# Patient Record
Sex: Female | Born: 1965 | Race: Black or African American | Hispanic: No | Smoking: Never smoker
Health system: Southern US, Community
[De-identification: ages and names within clinical notes are randomized; demographics above are authoritative.]

## PROBLEM LIST (undated history)

## (undated) DIAGNOSIS — I10 Essential (primary) hypertension: Secondary | ICD-10-CM

## (undated) DIAGNOSIS — T7840XA Allergy, unspecified, initial encounter: Secondary | ICD-10-CM

## (undated) HISTORY — DX: Allergy, unspecified, initial encounter: T78.40XA

---

## 1998-01-18 ENCOUNTER — Encounter: Admission: RE | Admit: 1998-01-18 | Discharge: 1998-01-18 | Payer: Self-pay | Admitting: Obstetrics

## 2000-06-11 ENCOUNTER — Encounter: Admission: RE | Admit: 2000-06-11 | Discharge: 2000-06-11 | Payer: Self-pay | Admitting: Obstetrics

## 2002-03-31 ENCOUNTER — Other Ambulatory Visit: Admission: RE | Admit: 2002-03-31 | Discharge: 2002-03-31 | Payer: Self-pay | Admitting: Obstetrics and Gynecology

## 2002-03-31 ENCOUNTER — Encounter: Admission: RE | Admit: 2002-03-31 | Discharge: 2002-03-31 | Payer: Self-pay | Admitting: Obstetrics and Gynecology

## 2005-10-26 ENCOUNTER — Emergency Department (HOSPITAL_COMMUNITY): Admission: EM | Admit: 2005-10-26 | Discharge: 2005-10-26 | Payer: Self-pay | Admitting: Family Medicine

## 2006-05-24 ENCOUNTER — Emergency Department (HOSPITAL_COMMUNITY): Admission: EM | Admit: 2006-05-24 | Discharge: 2006-05-24 | Payer: Self-pay | Admitting: Family Medicine

## 2007-02-16 ENCOUNTER — Emergency Department (HOSPITAL_COMMUNITY): Admission: EM | Admit: 2007-02-16 | Discharge: 2007-02-16 | Payer: Self-pay | Admitting: Emergency Medicine

## 2007-03-01 ENCOUNTER — Ambulatory Visit (HOSPITAL_COMMUNITY): Admission: RE | Admit: 2007-03-01 | Discharge: 2007-03-01 | Payer: Self-pay | Admitting: Obstetrics & Gynecology

## 2007-03-01 ENCOUNTER — Encounter: Payer: Self-pay | Admitting: Internal Medicine

## 2008-10-25 ENCOUNTER — Encounter: Payer: Self-pay | Admitting: Internal Medicine

## 2008-11-01 ENCOUNTER — Encounter: Payer: Self-pay | Admitting: Internal Medicine

## 2008-11-03 ENCOUNTER — Ambulatory Visit: Payer: Self-pay | Admitting: *Deleted

## 2008-11-03 ENCOUNTER — Ambulatory Visit: Payer: Self-pay | Admitting: Internal Medicine

## 2008-11-03 ENCOUNTER — Encounter: Payer: Self-pay | Admitting: Internal Medicine

## 2008-11-03 ENCOUNTER — Inpatient Hospital Stay (HOSPITAL_COMMUNITY): Admission: AD | Admit: 2008-11-03 | Discharge: 2008-11-07 | Payer: Self-pay | Admitting: Internal Medicine

## 2008-11-03 ENCOUNTER — Ambulatory Visit: Payer: Self-pay | Admitting: Cardiology

## 2008-11-03 DIAGNOSIS — R55 Syncope and collapse: Secondary | ICD-10-CM | POA: Insufficient documentation

## 2008-11-03 LAB — CONVERTED CEMR LAB
ALT: 28 units/L (ref 0–35)
AST: 26 units/L (ref 0–37)
Albumin: 3.2 g/dL — ABNORMAL LOW (ref 3.5–5.2)
Basophils Absolute: 0 10*3/uL (ref 0.0–0.1)
Basophils Relative: 1 % (ref 0–1)
CO2: 26 meq/L (ref 19–32)
Calcium: 8.8 mg/dL (ref 8.4–10.5)
Chloride: 105 meq/L (ref 96–112)
Cholesterol: 123 mg/dL (ref 0–200)
Creatinine, Ser: 0.99 mg/dL (ref 0.40–1.20)
Eosinophils Relative: 1 % (ref 0–5)
HCT: 40.7 % (ref 36.0–46.0)
Ketones, ur: NEGATIVE mg/dL
Lymphocytes Relative: 38 % (ref 12–46)
MCHC: 34.3 g/dL (ref 30.0–36.0)
MCV: 95.3 fL (ref 78.0–100.0)
Magnesium: 2 mg/dL (ref 1.5–2.5)
Neutro Abs: 3.9 10*3/uL (ref 1.7–7.7)
Nitrite: NEGATIVE
Platelets: 188 10*3/uL (ref 150–400)
RBC: 4.27 M/uL (ref 3.87–5.11)
RDW: 12.8 % (ref 11.5–15.5)
Sodium: 139 meq/L (ref 135–145)
TSH: 2.28 microintl units/mL (ref 0.350–4.50)
Total Bilirubin: 0.5 mg/dL (ref 0.3–1.2)
Total CHOL/HDL Ratio: 2
Total Protein: 7.7 g/dL (ref 6.0–8.3)
Urobilinogen, UA: 1 (ref 0.0–1.0)
VLDL: 22 mg/dL (ref 0–40)
pH: 6 (ref 5.0–8.0)

## 2008-11-04 ENCOUNTER — Ambulatory Visit: Payer: Self-pay | Admitting: Vascular Surgery

## 2008-11-04 ENCOUNTER — Encounter: Payer: Self-pay | Admitting: Internal Medicine

## 2008-11-07 ENCOUNTER — Encounter (INDEPENDENT_AMBULATORY_CARE_PROVIDER_SITE_OTHER): Payer: Self-pay | Admitting: Internal Medicine

## 2008-11-09 ENCOUNTER — Ambulatory Visit: Payer: Self-pay | Admitting: Internal Medicine

## 2008-11-09 DIAGNOSIS — Z86711 Personal history of pulmonary embolism: Secondary | ICD-10-CM | POA: Insufficient documentation

## 2008-11-09 LAB — CONVERTED CEMR LAB: INR: 3.4

## 2008-11-13 ENCOUNTER — Ambulatory Visit: Payer: Self-pay | Admitting: *Deleted

## 2008-11-13 LAB — CONVERTED CEMR LAB: INR: 2.1

## 2008-11-20 ENCOUNTER — Ambulatory Visit: Payer: Self-pay | Admitting: Internal Medicine

## 2008-11-20 LAB — CONVERTED CEMR LAB: INR: 1.8

## 2008-11-27 ENCOUNTER — Ambulatory Visit: Payer: Self-pay | Admitting: Internal Medicine

## 2008-12-11 ENCOUNTER — Ambulatory Visit: Payer: Self-pay | Admitting: Internal Medicine

## 2008-12-11 LAB — CONVERTED CEMR LAB

## 2008-12-13 ENCOUNTER — Emergency Department (HOSPITAL_COMMUNITY): Admission: EM | Admit: 2008-12-13 | Discharge: 2008-12-13 | Payer: Self-pay | Admitting: Emergency Medicine

## 2008-12-25 ENCOUNTER — Ambulatory Visit: Payer: Self-pay | Admitting: Internal Medicine

## 2009-01-08 ENCOUNTER — Ambulatory Visit: Payer: Self-pay | Admitting: Internal Medicine

## 2009-01-08 ENCOUNTER — Encounter: Payer: Self-pay | Admitting: Pharmacist

## 2009-01-17 ENCOUNTER — Telehealth: Payer: Self-pay | Admitting: Internal Medicine

## 2009-01-19 ENCOUNTER — Encounter: Payer: Self-pay | Admitting: Internal Medicine

## 2009-01-29 ENCOUNTER — Ambulatory Visit: Payer: Self-pay | Admitting: Internal Medicine

## 2009-01-29 LAB — CONVERTED CEMR LAB: INR: 2.2

## 2009-02-08 ENCOUNTER — Ambulatory Visit (HOSPITAL_COMMUNITY): Admission: RE | Admit: 2009-02-08 | Discharge: 2009-02-08 | Payer: Self-pay | Admitting: Internal Medicine

## 2009-02-19 ENCOUNTER — Ambulatory Visit: Payer: Self-pay | Admitting: Internal Medicine

## 2009-02-19 LAB — CONVERTED CEMR LAB: INR: 2.7

## 2009-04-02 ENCOUNTER — Ambulatory Visit: Payer: Self-pay | Admitting: Internal Medicine

## 2009-04-02 LAB — CONVERTED CEMR LAB: INR: 4

## 2009-05-03 ENCOUNTER — Ambulatory Visit: Payer: Self-pay | Admitting: Internal Medicine

## 2009-07-13 ENCOUNTER — Ambulatory Visit: Payer: Self-pay | Admitting: Surgery

## 2009-07-13 ENCOUNTER — Encounter (INDEPENDENT_AMBULATORY_CARE_PROVIDER_SITE_OTHER): Payer: Self-pay | Admitting: Sports Medicine

## 2009-07-13 ENCOUNTER — Ambulatory Visit (HOSPITAL_COMMUNITY): Admission: RE | Admit: 2009-07-13 | Discharge: 2009-07-13 | Payer: Self-pay | Admitting: Sports Medicine

## 2009-08-29 ENCOUNTER — Ambulatory Visit: Payer: Self-pay | Admitting: Internal Medicine

## 2009-08-29 DIAGNOSIS — S83509A Sprain of unspecified cruciate ligament of unspecified knee, initial encounter: Secondary | ICD-10-CM | POA: Insufficient documentation

## 2009-09-17 ENCOUNTER — Encounter: Payer: Self-pay | Admitting: Internal Medicine

## 2010-02-15 ENCOUNTER — Ambulatory Visit (HOSPITAL_COMMUNITY): Admission: RE | Admit: 2010-02-15 | Discharge: 2010-02-15 | Payer: Self-pay | Admitting: Obstetrics & Gynecology

## 2010-02-22 ENCOUNTER — Ambulatory Visit (HOSPITAL_COMMUNITY): Admission: RE | Admit: 2010-02-22 | Discharge: 2010-02-22 | Payer: Self-pay | Admitting: Obstetrics & Gynecology

## 2010-09-10 ENCOUNTER — Ambulatory Visit: Payer: Self-pay | Admitting: Internal Medicine

## 2010-09-10 DIAGNOSIS — G4700 Insomnia, unspecified: Secondary | ICD-10-CM | POA: Insufficient documentation

## 2010-09-13 ENCOUNTER — Ambulatory Visit (HOSPITAL_COMMUNITY)
Admission: RE | Admit: 2010-09-13 | Discharge: 2010-09-13 | Payer: Self-pay | Source: Home / Self Care | Attending: Internal Medicine | Admitting: Internal Medicine

## 2010-09-26 DIAGNOSIS — E559 Vitamin D deficiency, unspecified: Secondary | ICD-10-CM | POA: Insufficient documentation

## 2010-09-26 DIAGNOSIS — R928 Other abnormal and inconclusive findings on diagnostic imaging of breast: Secondary | ICD-10-CM | POA: Insufficient documentation

## 2010-09-27 LAB — CONVERTED CEMR LAB
HDL: 65 mg/dL (ref >39)
LDL Cholesterol: 64 mg/dL (ref 0–99)
TSH: 1.368 microintl units/mL (ref 0.350–4.50)
Vit D, 25-Hydroxy: <10 ng/mL — ABNORMAL LOW (ref 30–89)

## 2010-09-30 ENCOUNTER — Encounter
Admission: RE | Admit: 2010-09-30 | Discharge: 2010-09-30 | Payer: Self-pay | Source: Home / Self Care | Attending: Internal Medicine | Admitting: Internal Medicine

## 2010-10-06 ENCOUNTER — Encounter: Payer: Self-pay | Admitting: Obstetrics

## 2010-10-15 NOTE — Letter (Signed)
Summary: Generic Letter  [facility]  [address]   [place], [place] [address]   Phone: [phone]  Fax: [phone]    09/17/2009  TO: Valma Cava, MD Hassan Rowan  RE: TAIMI TOWE [address] [place] [place], [place]  [address]  Dear Dr. Thomasena Edis,  This letter is in reference to Ms. Irine Heminger who was evaluated in my office on 12/15 for pre-surgical medical clearance after sustaining ligamentous injury to her knee. As you know, Ms. Potts has a history of Pulmonary embolism in February 2009 felt to be caused by the use of oral contraceptives. She completed 6 months of coumadin therapy and has had no signs of recurrent problems or symptoms. She is otherwise extremely active and healthy. I think by June of 2011 she should be safe for surgical intervention. I am concerned about her post operative period and risk for DVT, so depending on her length of immobilization, she will need to be on some form of anticoagulation.   Please contact me if you have any further concerns or questions.    Sincerely,    Anderson Malta, DO

## 2010-10-17 NOTE — Assessment & Plan Note (Signed)
Summary: FU VISIT/DS   Vital Signs:  Patient profile:   45 year old female Height:      65 inches Weight:      175.2 pounds BMI:     29.26 Temp:     97.8 degrees F oral Pulse rate:   69 / minute BP sitting:   119 / 79  (right arm)  Vitals Entered By: Filomena Jungling NT II (September 10, 2010 3:55 PM) Is Patient Diabetic? No Pain Assessment Patient in pain? no      Nutritional Status BMI of 25 - 29 = overweight  Have you ever been in a relationship where you felt threatened, hurt or afraid?No   Does patient need assistance? Functional Status Self care Ambulation Normal   History of Present Illness: Margaret Pacheco is here for routine follow-up and preventive care. No questions or concerns today. No leg pain or SOB. Back to her normal activity level.  Anticoagulation Management History:      Her last INR was 4.0.    Preventive Screening-Counseling & Management  Alcohol-Tobacco     Alcohol drinks/day: 0     Smoking Status: never  Caffeine-Diet-Exercise     Does Patient Exercise: yes     Type of exercise: weights  Current Medications (verified): 1)  Trazodone Hcl 150 Mg Tabs (Trazodone Hcl) .... Take 1 Tablet By Mouth Once A Day At Bedtime As Needed For Sleep  Allergies (verified): 1)  ! Pcn 2)  ! Tri-Norinyl (28) (Norethin-Eth Estrad Triphasic)   Impression & Recommendations:  Problem # 1:  PULMONARY EMBOLISM (ICD-415.19) Resolved. Doing well-will continue to follow for any signs of PAH.  Problem # 2:  ANTERIOR CRUCIATE LIGAMENT TEAR, RIGHT KNEE (ICD-844.2) Injury has stabilized. Pain with over activity. Will check vitamin D level.  Orders: T-Vitamin D (25-Hydroxy) [phone])  Problem # 3:  Preventive Health Care (ICD-V70.0) Due for Mammogram, lipids, routine BW. Discussed GYN care and DVT risk.   Greater than 25 minutes spent in face to face discussion with this patient and in review of the medical record. Greater than 50% of this time was spent in education,  counseling and providing instruction on complex medication and medical management issues.   Problem # 4:  INSOMNIA (ICD-780.52) Mild issues with sleep-will try Trazadone. Discussed sleep hygiene and dynamics. Will check TSH screen.  Orders: T-TSH ([id number])  Complete Medication List: 1)  Trazodone Hcl 150 Mg Tabs (Trazodone hcl) .... Take 1 tablet by mouth once a day at bedtime as needed for sleep  Other Orders: T-Lipid Profile ([id number]) Mammogram (Screening) (Mammo)  Patient Instructions: 1)  F/U 1 year or as needed 2)  Will contact you regarding aby abnormal labs. Prescriptions: TRAZODONE HCL 150 MG TABS (TRAZODONE HCL) Take 1 tablet by mouth once a day at bedtime as needed for sleep  #30 x 0   Entered and Authorized by:   Julaine Fusi  DO   Signed by:   Julaine Fusi  DO on 09/10/2010   Method used:   Electronically to        [facility] [phone]* (retail)       [address]       [address]       [place], [place]  [address]       Ph: [phone]       Fax: [phone]   RxID:   [id number]    Orders Added: 1)  T-Lipid Profile [[id number]] 2)  Est. Patient Level III [10932] 3)  T-TSH [[id number]] 4)  T-Vitamin D (25-Hydroxy) [phone] 5)  Mammogram (Screening) [Mammo]    Prevention & Chronic Care Immunizations   Influenza vaccine: Not documented   Influenza vaccine deferral: Refused  (09/10/2010)    Tetanus booster: Not documented   Td booster deferral: Deferred  (09/10/2010)    Pneumococcal vaccine: Not documented  Other Screening   Pap smear: Not documented   Pap smear action/deferral: Not indicated-other  (09/10/2010)    Mammogram: No specific mammographic evidence of malignancy.    (02/21/2008)   Mammogram action/deferral: Ordered  (09/10/2010)   Smoking status: never  (09/10/2010)    Screening comments: [facility] see's for GYN Care  Lipids   Total Cholesterol: 123  (11/03/2008)   Lipid panel  action/deferral: Lipid Panel ordered   LDL: 40  (11/03/2008)   LDL Direct: Not documented   HDL: 61  (11/03/2008)   Triglycerides: 111  (11/03/2008)   Nursing Instructions: Schedule screening mammogram (see order)   Process Orders Check Orders Results:     [facility]: ABN not required for this insurance Tests Sent for requisitioning (September 26, 2010 3:52 PM):     09/10/2010: [facility] -- T-Lipid Profile [phone] (signed)     09/10/2010: [facility] -- T-TSH [phone] (signed)     09/10/2010: [facility] -- T-Vitamin D (25-Hydroxy) [phone] (signed)

## 2010-11-15 ENCOUNTER — Other Ambulatory Visit: Payer: Self-pay | Admitting: Internal Medicine

## 2010-11-15 DIAGNOSIS — R9230 Dense breasts, unspecified: Secondary | ICD-10-CM

## 2010-11-15 DIAGNOSIS — R922 Inconclusive mammogram: Secondary | ICD-10-CM

## 2010-11-15 DIAGNOSIS — Z803 Family history of malignant neoplasm of breast: Secondary | ICD-10-CM

## 2010-12-02 LAB — CBC
MCHC: 34.4 g/dL (ref 30.0–36.0)
RBC: 4.17 MIL/uL (ref 3.87–5.11)
WBC: 5.6 10*3/uL (ref 4.0–10.5)

## 2010-12-02 LAB — PREGNANCY, URINE
Preg Test, Ur: NEGATIVE
Preg Test, Ur: NEGATIVE

## 2010-12-26 LAB — BASIC METABOLIC PANEL
CO2: 29 mEq/L (ref 19–32)
Chloride: 103 mEq/L (ref 96–112)
GFR calc Af Amer: >60 mL/min (ref >60)
Sodium: 138 mEq/L (ref 135–145)

## 2010-12-26 LAB — DIFFERENTIAL
Basophils Relative: 0 % (ref 0–1)
Eosinophils Absolute: 0 10*3/uL (ref 0.0–0.7)
Lymphs Abs: 1.1 10*3/uL (ref 0.7–4.0)
Monocytes Absolute: 0.2 10*3/uL (ref 0.1–1.0)
Monocytes Relative: 3 % (ref 3–12)

## 2010-12-26 LAB — URINALYSIS, ROUTINE W REFLEX MICROSCOPIC
Bilirubin Urine: NEGATIVE
Ketones, ur: NEGATIVE mg/dL
Leukocytes, UA: NEGATIVE
Nitrite: NEGATIVE
Specific Gravity, Urine: 1.012 (ref 1.005–1.030)
Urobilinogen, UA: 0.2 mg/dL (ref 0.0–1.0)

## 2010-12-26 LAB — CBC
Hemoglobin: 14.5 g/dL (ref 12.0–15.0)
MCHC: 34.2 g/dL (ref 30.0–36.0)
MCV: 94 fL (ref 78.0–100.0)
RBC: 4.5 MIL/uL (ref 3.87–5.11)

## 2010-12-26 LAB — URINE MICROSCOPIC-ADD ON

## 2010-12-31 LAB — LUPUS ANTICOAGULANT PANEL
DRVVT: 45.1 secs (ref 36.1–47.0)
Lupus Anticoagulant: NOT DETECTED
PTT Lupus Anticoagulant: 152.9 secs — ABNORMAL HIGH (ref 36.3–48.8)
PTTLA 4:1 Mix: 99.8 secs — ABNORMAL HIGH (ref 36.3–48.8)
PTTLA Confirmation: 6.3 secs (ref <8.0)

## 2010-12-31 LAB — BASIC METABOLIC PANEL
BUN: 6 mg/dL (ref 6–23)
CO2: 24 mEq/L (ref 19–32)
CO2: 25 mEq/L (ref 19–32)
CO2: 26 mEq/L (ref 19–32)
Calcium: 7.9 mg/dL — ABNORMAL LOW (ref 8.4–10.5)
Calcium: 8.4 mg/dL (ref 8.4–10.5)
Calcium: 8.5 mg/dL (ref 8.4–10.5)
Creatinine, Ser: 0.81 mg/dL (ref 0.4–1.2)
Creatinine, Ser: 0.83 mg/dL (ref 0.4–1.2)
Creatinine, Ser: 0.94 mg/dL (ref 0.4–1.2)
GFR calc Af Amer: >60 mL/min (ref >60)
GFR calc Af Amer: >60 mL/min (ref >60)
GFR calc non Af Amer: >60 mL/min (ref >60)
GFR calc non Af Amer: >60 mL/min (ref >60)
GFR calc non Af Amer: >60 mL/min (ref >60)
Glucose, Bld: 79 mg/dL (ref 70–99)
Glucose, Bld: 87 mg/dL (ref 70–99)
Sodium: 137 mEq/L (ref 135–145)
Sodium: 139 mEq/L (ref 135–145)

## 2010-12-31 LAB — CBC
HCT: 34.4 % — ABNORMAL LOW (ref 36.0–46.0)
Hemoglobin: 11.9 g/dL — ABNORMAL LOW (ref 12.0–15.0)
Hemoglobin: 12.9 g/dL (ref 12.0–15.0)
MCHC: 34.6 g/dL (ref 30.0–36.0)
MCHC: 34.9 g/dL (ref 30.0–36.0)
MCHC: 34.9 g/dL (ref 30.0–36.0)
MCV: 96 fL (ref 78.0–100.0)
Platelets: 173 10*3/uL (ref 150–400)
Platelets: 192 10*3/uL (ref 150–400)
RBC: 3.59 MIL/uL — ABNORMAL LOW (ref 3.87–5.11)
RDW: 12.2 % (ref 11.5–15.5)
RDW: 12.3 % (ref 11.5–15.5)
RDW: 12.4 % (ref 11.5–15.5)

## 2010-12-31 LAB — CARDIAC PANEL(CRET KIN+CKTOT+MB+TROPI)
CK, MB: 1.4 ng/mL (ref 0.3–4.0)
CK, MB: 1.6 ng/mL (ref 0.3–4.0)
CK, MB: 2 ng/mL (ref 0.3–4.0)
Relative Index: 1.2 (ref 0.0–2.5)
Relative Index: 1.2 (ref 0.0–2.5)
Total CK: 131 U/L (ref 7–177)
Total CK: 163 U/L (ref 7–177)

## 2010-12-31 LAB — URINE CULTURE
Colony Count: NO GROWTH
Culture: NO GROWTH
Special Requests: NEGATIVE

## 2010-12-31 LAB — PROTIME-INR
Prothrombin Time: 14.3 seconds (ref 11.6–15.2)
Prothrombin Time: 18.2 seconds — ABNORMAL HIGH (ref 11.6–15.2)

## 2010-12-31 LAB — URINALYSIS, ROUTINE W REFLEX MICROSCOPIC
Bilirubin Urine: NEGATIVE
Glucose, UA: NEGATIVE mg/dL
Protein, ur: NEGATIVE mg/dL
Specific Gravity, Urine: 1.01 (ref 1.005–1.030)
Urobilinogen, UA: 0.2 mg/dL (ref 0.0–1.0)

## 2010-12-31 LAB — BETA-2-GLYCOPROTEIN I ABS, IGG/M/A: Beta-2-Glycoprotein I IgM: <4 U/mL (ref <10)

## 2010-12-31 LAB — URINE MICROSCOPIC-ADD ON

## 2010-12-31 LAB — CARDIOLIPIN ANTIBODIES, IGG, IGM, IGA
Anticardiolipin IgA: 9 [APL'U] — ABNORMAL LOW (ref <13)
Anticardiolipin IgG: <7 [GPL'U] — ABNORMAL LOW (ref <11)
Anticardiolipin IgM: <7 [MPL'U] — ABNORMAL LOW (ref <10)

## 2010-12-31 LAB — HEPARIN LEVEL (UNFRACTIONATED)
Heparin Unfractionated: 0.27 IU/mL — ABNORMAL LOW (ref 0.30–0.70)
Heparin Unfractionated: 0.39 IU/mL (ref 0.30–0.70)
Heparin Unfractionated: 0.56 IU/mL (ref 0.30–0.70)

## 2010-12-31 LAB — PROTEIN S ACTIVITY: Protein S Activity: 101 % (ref 69–129)

## 2010-12-31 LAB — HOMOCYSTEINE: Homocysteine: 7.6 umol/L (ref 4.0–15.4)

## 2010-12-31 LAB — RAPID URINE DRUG SCREEN, HOSP PERFORMED
Cocaine: NOT DETECTED
Opiates: NOT DETECTED
Tetrahydrocannabinol: NOT DETECTED

## 2010-12-31 LAB — LIPID PANEL
HDL: 42 mg/dL (ref >39)
Total CHOL/HDL Ratio: 2.5 RATIO
VLDL: 18 mg/dL (ref 0–40)

## 2010-12-31 LAB — PROTEIN C ACTIVITY: Protein C Activity: 183 % — ABNORMAL HIGH (ref 75–133)

## 2010-12-31 LAB — PROTHROMBIN GENE MUTATION

## 2010-12-31 LAB — PREGNANCY, URINE: Preg Test, Ur: NEGATIVE

## 2011-01-28 NOTE — Discharge Summary (Signed)
Margaret Pacheco, Margaret Pacheco NO.:  [id number]   MEDICAL RECORD NO.:  [id number]          PATIENT TYPE:  INP   LOCATION:  4714                         FACILITY:  [facility]   PHYSICIAN:  C. Ulyess Mort, M.D.DATE OF BIRTH:  09/05/1966   DATE OF ADMISSION:  11/03/2008  DATE OF DISCHARGE:  11/07/2008                               DISCHARGE SUMMARY   DISCHARGE DIAGNOSIS:  Pulmonary embolism.   DISCHARGE MEDICATIONS:  1. Lovenox 85 mg subcutaneous injection twice daily.  2. Coumadin 5 mg daily in the evening.   DISPOSITION AND FOLLOWUP:  The patient will return to the [facility] Teaching [facility] for a Warfarin Clinic appointment with  Dr. Michaell Cowing, phone number [phone].  The appointment is in 2 days on  Thursday, November 09, 2008, at 3:15.  This appointment is to check the  patient's INR level.  This appointment is also for a warfarin education,  and while at this appointment, the patient will make her followup  appointment with Dr. Anderson Malta at the same location at [facility].  The patient will follow with  Dr. Phillips Odor for coordination of care, for followup following this  massive pulmonary embolism, for prescription of warfarin, followup of  her hypercoagulable panel, and for management of all other medical  problems.  The patient should also schedule an appointment with Dr. Antionette Char, phone number [phone].  The patient is to follow with  Dr. Tamela Oddi to discuss the patient's recent discontinuation of  oral birth control secondary to pulmonary embolism and discuss other  methods of birth control.   PROCEDURES PERFORMED:  The patient had a CT angiogram on November 03, 2008, indicative of pulmonary embolism.  This study found significant  bilateral pulmonary emboli involving branches of the pulmonary arteries  primarily, the lower lobes, lingular and right middle lobe and also to  both  upper lobes.  The emboli involving branches of lower lobes are  partially occlusive.  Impression:  Significant bilateral pulmonary  emboli primarily involving branches to the lower lobes, which are  partially occlusive.   The patient also had a plain film of the left finger and thumb, which  showed no acute fracture and normal alignment.   The patient also had a plain film of the left wrist where there was no  acute fracture seen, a normal radiocarpal joint space, and normal  position of the carpal bones.   The patient also had a 2-D echo on November 03, 2008, this echo showed  right ventricular dilatation with moderate-to-severe right ventricular  dysfunction.  Recommended evaluation for pulmonary embolus and possible  right heart catheterization, this was read by Dr. Nicholes Mango.   CONSULTANTS:  Everardo Beals Juanda Chance, MD, [facility], Cardiology.  Dr. Juanda Chance  initially saw the patient for evaluation of syncope.  At that time, the  troponin returned during that visit was elevated.  This elevation was  isolated in comparison to CK, CK-MB.  Dr. Juanda Chance recommended CT  angiogram for pulmonary embolism and also noted on echocardiogram that  the patient had right ventricular enlargement and recommended later  right and left heart catheterization if the pulmonary embolism proved  negative.   BRIEF ADMITTING HISTORY AND PHYSICAL:  Margaret Pacheco is a previously  healthy 45 year old woman with no medical problems who had a syncopal  event 2 days prior to admission.  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  No loss of bladder or bowel continence.  No biting of  tongue.  The patient awoke and said that she felt very out of  breath  like she had just run a race.  She was not confused.  She did not have  any paresthesias or any signs of being postictal.  She went to the [facility] Emergency Department where workup included a head CT, which was  reportedly negative for the patient and basic chemistries, which per the  patient and per records obtained showed a potassium level of 3.  Per the  patient, the patient was discharged with a potassium supplement and sent  to follow up with Outpatient Medicine Clinic 2 days later, which is the  day of admission.  The patient appeared to the Outpatient Medicine  Clinic on the day of admission and was evaluated by Dr. Anderson Malta, who felt that her story of syncope was quite concerning and  warranted inpatient admission for evaluation of the patient's syncope.  When I saw the patient on the floor, she related a similar story as I  mentioned in the terms of the syncope history.  She denied any recent  plane travel, any recent surgery, or immobilization.  Denied any history  of clotting or bleeding problems in her family, denied seizures  disorders in her family, denied sudden cardiac death in her family.  She  also denied any type of recent sickness or illness.  She says the last  time she was sick was over 4 months ago.  She did endorse some decreased  urine output at the time.   ALLERGIES:  PENICILLIN, which causes rash.   PAST MEDICAL HISTORY:  Significant for the recent ER visits at [facility] ED for this syncopal event and hypokalemia.  The patient did have  childhood asthma.  She is not on any current medication and she gives a  history of allergic rhinitis.  She is also not on any medications and  does not see a doctor for that.   MEDICATIONS:  Oral contraceptive pills and this potassium supplement  that she was prescribed in [facility] ED.   SOCIAL HISTORY:  The patient is not a smoker, has never been a smoker.  Does not drink.  Does not use cocaine or  IV drugs.  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   FAMILY HISTORY:  Negative for everything, I mentioned in the HPI.  Her  mother died of congestive heart failure at 29.  Her father died of lung  cancer at 75.  She has 4 siblings.  They are healthy, they do have  hypertension and diabetes in her family.  There is no history of breast  cancer, clotting disorder, bleeding disorder, seizure disorder, or  sudden death.  PHYSICAL EXAMINATION:  VITAL SIGNS:  Temperature 97.3, blood pressure  132/92, pulse 101, she was sating 99% on room air.  GENERAL:  She is very comfortable, not in any acute distress.  Her eyes  are anicteric.  Pupils are equal, round, and reactive to light.  Extraocular movements were intact.  Oropharynx is moist.  There was no  erythema or exudate.  NECK:  There is no JVD or thyromegaly.  LUNGS:  Clear to auscultation bilaterally.  There were no wheezes,  rhonchi, no crackles.  There was good air movement.  CARDIOVASCULAR:  Regular rhythm and rate.  No murmurs, rubs, or gallops.  ABDOMEN:  The patient did have some periumbilical tenderness.  No  distention.  She also did have some right CVA tenderness.  No guarding.  No rebound.  EXTREMITIES:  The lower extremities were equal and identical in size.  There was no absolutely no edema in either extremity.  There are no  palpable cords in either calf.  There were good pulses distally in both  upper and lower extremities.  NEUROLOGIC:  Cranial nerves II through XII were grossly intact.  The  patient's strength was full.  Strength and sensation were full and  symmetric throughout.   ADMITTING LABORATORY DATA:  Sodium 139, potassium 4.3, chloride 105,  bicarb 26, BUN 10, creatinine 1, and glucose 89.  White blood cell count  7.3 with ANC of 3.9, hemoglobin and hematocrit 13.9 and 40.7, platelets  188, bilirubin 25, alkaline phosphatase 84, AST 26, ALT 28, protein 7.7,  albumin 3.2, and calcium 8.8.  The  patient did have a urinalysis on  admission; however, it is contaminated.   HOSPITAL COURSE BY PROBLEM:  1. Syncope secondary to bilateral pulmonary emboli.  The patient was      admitted for syncope workup.  She initially included a very broad      differential including arrhythmia, electrolyte imbalance, seizure,      orthostatic hypotension, and structural neurologic.  During the      initial cardiac workup, the patient did have an EKG, which showed      prolong QT and some T-wave abnormalities in the lateral leads.  The      patient got a cardiac echo, which showed right ventricular      dilatation with strain suggesting pulmonary embolism.  At that      time, a stat CT angiogram was ordered and the patient was found to      have a massive pulmonary embolism with some obstruction in the      lower lobes.  Throughout this, the patient remained hemodynamically      stable, was not dyspneic, and was not short of breath.  The patient      was transferred to an ICU, was started on a morphine drip and      remained asymptomatic.  After the CT angiogram showed this massive      pulmonary embolism, Cardiology was consulted.  Along with the      primary team, Dr. Daiva Eves was also consulted who was the primary      attending over the weekend to discuss possible need for      thrombolytics in this patient.  It was felt that given the      patient's lack of any type of hemodynamic instability that the use      of thrombolytics was not indicated.  The patient was continued on      heparin  drip for 1-day, warfarin was held until November 05, 2008,      in case the patient did become hemodynamically unstable.  The      patient did not and was started on Coumadin on November 05, 2008.      On discharge, the patient's INR is 1.4.  She will be followed in      the Warfarin Clinic with Dr. Michaell Cowing to recheck therapeutic INR of      2.0.  This Coumadin will be continued indefinitely given the       patient's history of unprovoked DVT and/or unprovoked massive      pulmonary embolism.  It should be noted the patient did not have      any other risk factors for pulmonary embolism other than oral      contraceptive pills.  There is a hypercoagulable panel that remains      pending, that may elucidate the cause for this pulmonary embolism.      If not, the utility of longterm anticoagulation must be weighed      with the risks for this patient.  This decision will be handled in      the outpatient clinic by Dr. Anderson Malta.  2. Hypokalemia.  The patient was reportedly hypokalemic at the [facility] ED with a calcium of 3.0, however, her potassium during her      stay here was always within the normal limits.  She was never able      to describe any GI or urinary cause of this loss of potassium.  We      will follow in outpatient clinic.  3. Left thumb and wrist pain.  When the patient had her syncopal      event, she did fall on her hand and related some pain to her thumb      and wrist.  The patient had no scaphoid tenderness.  The patient      had no fracture.  The patient had full range of motion and good      distal pulses.  The swelling went down during her stay.   DISCHARGE LABS AND VITALS:  Chemistries are from November 06, 2008;  sodium 137, potassium 3.5, chloride 104, bicarb 25, BUN 7, creatinine  0.8, and glucose 81.  White blood cell count 6.5, hematocrit and  hemoglobin 12.9 and 37, and platelets 192.  The patient had a PT/INR,  which was 18.2 and 1.4 respectively and heparin level which was 0.41.  On discharge, the patient had taken heparin and Coumadin.  Instead of  heparin, she will take Lovenox for the next several days until her INR  become therapeutic.  The patient is comfortable giving this medication  to herself subcutaneously.  The patient's sister is an Charity fundraiser who is also  comfortable helping her with the shot.  However, we will keep her until  this  evening, so that she can have some education on the shot and see  one injection given on her own.  Vitals on the day of discharge, temperature 97.1, sytolic blood pressure  124/136, diastolic blood pressure 84/87, pulse 71, breathing 18 times a  minute.   Dictation by Sarita Bottom, MS IV      Joaquin Courts, MD  Electronically Signed      C. Ulyess Mort, M.D.  Electronically Signed    VW/MEDQ  D:  11/07/2008  T:  11/08/2008  Job:  [id number]  cc:   Edsel Petrin, D.O.  Roseanna Rainbow, M.D.

## 2011-01-28 NOTE — H&P (Signed)
NAMESHARRYN, BELDING NO.:  [id number]   MEDICAL RECORD NO.:  [id number]          PATIENT TYPE:  INP   LOCATION:  2316                         FACILITY:  [facility]   PHYSICIAN:  Everardo Beals. Juanda Chance, MD, FACCDATE OF BIRTH:  01/19/1966   DATE OF ADMISSION:  11/03/2008  DATE OF DISCHARGE:                              HISTORY & PHYSICAL   REFERRING PHYSICIAN:  C. Ulyess Mort, M.D.   REASON FOR REFERRAL:  Evaluation of syncope and an abnormal ECG and  echocardiogram.   CLINICAL HISTORY:  Mrs. Bellisario is 45 years old and lives in [place]  and is a Runner, broadcasting/film/video and also coaches.  She is quite active and works out  regularly.  On Wednesday morning, she developed some shortness of breath  while doing some mild exercise.  Later in the afternoon, she was at  school walking across the schoolyard and felt lightheaded and dizzy and  then slumped to the ground and passed out briefly.  She was taken to  [facility] emergency room where she had an ECG and a CT scan.  A CT scan  of the head was reportedly negative.  She was subsequently seen in [facility] where a relative works and they performed  an echocardiogram, which showed right ventricular enlargement and right  ventricular dilatation.  There was minimal tricuspid regurgitation and  no right ventricular pressure could be estimated.  Her LV function was  good.  Based on this, she was admitted to the hospital for further  evaluation.   PAST MEDICAL HISTORY:  Negative for diabetes, hypertension,  hyperlipidemia.   She has not had any major surgery.   Her only medications include birth control pills.   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  She does not smoke.   FAMILY HISTORY:  Her mother has hypertension and diabetes and end-stage  renal disease.  Her father had diabetes and died of lung cancer.  There  is no history of coronary heart disease.   REVIEW OF SYSTEMS:   Positive for previous episode of dizziness.  Her  review of systems is otherwise negative.   PHYSICAL EXAMINATION:  VITAL SIGNS:  Blood pressure is 129/82 and the  pulse 86 and regular and the temperature 98.5.  There was no venous  tension.  NECK:  The carotid pulses were full without bruits.  CHEST:  Clear without rales or rhonchi.  CARDIAC:  Rhythm was regular.  The first and second heart sounds were  normal.  I did not think P2 was accentuated.  There was no murmur, and I  could hear no gallop.  ABDOMEN:  Soft with normal bowel sounds.  There is no  hepatosplenomegaly.  Peripheral pulses full with no peripheral edema.  MUSCULOSKELETAL:  No deformities.  SKIN:  Warm and dry.  NEUROLOGIC:  No focal neurological signs.   Her electrocardiogram initially showed lateral T-wave changes and then  subsequent ECG showed anterior T-wave inversions.   IMPRESSION:  1. Syncope of uncertain etiology.  2. Right ventricular enlargement  and right ventricular dysfunction,      rule out pulmonary embolism.  3. Positive troponin consistent with possible pulmonary embolism      versus possible non-ST elevation myocardial infarction.   RECOMMENDATIONS:  I certainly agree that pulmonary embolism is high on  the list of possibilities, and I agree with plans for a CT angio, which  is to be performed shortly.  If this gives a clear diagnosis of  pulmonary embolism, then I think that will explain the symptoms and  probably further evaluation is not needed.  If the CT angiogram is  negative for pulmonary embolism, I think she will need to be evaluated  further with a right and left heart catheterization.  We will follow her  with you.      Bruce Elvera Lennox Juanda Chance, MD, [facility]  Electronically Signed     BRB/MEDQ  D:  11/03/2008  T:  11/04/2008  Job:  [id number]

## 2011-03-17 ENCOUNTER — Encounter: Payer: Self-pay | Admitting: Internal Medicine

## 2011-07-03 LAB — POCT URINALYSIS DIP (DEVICE)
Ketones, ur: NEGATIVE
Operator id: 208841
Protein, ur: 100 — AB
Specific Gravity, Urine: 1.015

## 2011-07-03 LAB — URINE CULTURE: Colony Count: >=100000

## 2011-07-03 LAB — POCT PREGNANCY, URINE
Operator id: 235561
Preg Test, Ur: NEGATIVE

## 2011-08-22 ENCOUNTER — Other Ambulatory Visit: Payer: Self-pay | Admitting: Internal Medicine

## 2011-08-22 DIAGNOSIS — D649 Anemia, unspecified: Secondary | ICD-10-CM

## 2011-08-22 DIAGNOSIS — R0602 Shortness of breath: Secondary | ICD-10-CM

## 2011-08-25 ENCOUNTER — Other Ambulatory Visit (INDEPENDENT_AMBULATORY_CARE_PROVIDER_SITE_OTHER): Payer: BC Managed Care – PPO

## 2011-08-25 DIAGNOSIS — R0602 Shortness of breath: Secondary | ICD-10-CM

## 2011-08-25 DIAGNOSIS — D649 Anemia, unspecified: Secondary | ICD-10-CM

## 2011-08-25 LAB — CBC
HCT: 39.1 % (ref 36.0–46.0)
Hemoglobin: 12.7 g/dL (ref 12.0–15.0)
MCH: 30 pg (ref 26.0–34.0)
MCV: 92.2 fL (ref 78.0–100.0)
RBC: 4.24 MIL/uL (ref 3.87–5.11)

## 2011-08-25 LAB — COMPREHENSIVE METABOLIC PANEL
Alkaline Phosphatase: 77 U/L (ref 39–117)
BUN: 12 mg/dL (ref 6–23)
Glucose, Bld: 82 mg/dL (ref 70–99)
Sodium: 141 mEq/L (ref 135–145)
Total Bilirubin: 0.2 mg/dL — ABNORMAL LOW (ref 0.3–1.2)

## 2013-01-10 ENCOUNTER — Telehealth: Payer: Self-pay | Admitting: *Deleted

## 2013-01-10 NOTE — Telephone Encounter (Signed)
Spoke with patient is having surgery done on her feet.  Has a question as to if she will need a Clearance since she has a history of a PE.  Angelina Ok, RN 01/06/2013 9:00 AM.

## 2013-01-11 NOTE — Telephone Encounter (Signed)
That would be a question for the foot doctor as I have no idea what surgery will be done etc. Although I have never seen this patient and no one else has in the last 2 years or so and it may be wise to have her come in for a visit with someone to check on her health.   Dr. Dorise Hiss

## 2013-02-01 ENCOUNTER — Telehealth: Payer: Self-pay | Admitting: *Deleted

## 2013-02-21 ENCOUNTER — Ambulatory Visit: Payer: BC Managed Care – PPO | Admitting: Internal Medicine

## 2013-02-24 ENCOUNTER — Ambulatory Visit: Payer: BC Managed Care – PPO | Admitting: Internal Medicine

## 2013-02-28 ENCOUNTER — Encounter: Payer: Self-pay | Admitting: Internal Medicine

## 2013-02-28 ENCOUNTER — Ambulatory Visit (INDEPENDENT_AMBULATORY_CARE_PROVIDER_SITE_OTHER): Payer: BC Managed Care – PPO | Admitting: Internal Medicine

## 2013-02-28 VITALS — BP 122/76 | HR 73 | Temp 97.4°F | Ht 65.0 in | Wt 185.3 lb

## 2013-02-28 DIAGNOSIS — Z7189 Other specified counseling: Secondary | ICD-10-CM

## 2013-02-28 DIAGNOSIS — I2699 Other pulmonary embolism without acute cor pulmonale: Secondary | ICD-10-CM

## 2013-02-28 DIAGNOSIS — Z7689 Persons encountering health services in other specified circumstances: Secondary | ICD-10-CM | POA: Insufficient documentation

## 2013-02-28 DIAGNOSIS — E559 Vitamin D deficiency, unspecified: Secondary | ICD-10-CM

## 2013-02-28 LAB — CBC WITH DIFFERENTIAL/PLATELET
Basophils Absolute: 0 K/uL (ref 0.0–0.1)
Basophils Relative: 0 % (ref 0–1)
Eosinophils Absolute: 0.1 K/uL (ref 0.0–0.7)
Eosinophils Relative: 1 % (ref 0–5)
HCT: 39.3 % (ref 36.0–46.0)
Hemoglobin: 13.3 g/dL (ref 12.0–15.0)
Lymphocytes Relative: 42 % (ref 12–46)
Lymphs Abs: 2.4 K/uL (ref 0.7–4.0)
MCH: 31 pg (ref 26.0–34.0)
MCHC: 33.8 g/dL (ref 30.0–36.0)
MCV: 91.6 fL (ref 78.0–100.0)
Monocytes Absolute: 0.5 K/uL (ref 0.1–1.0)
Monocytes Relative: 9 % (ref 3–12)
Neutro Abs: 2.8 K/uL (ref 1.7–7.7)
Neutrophils Relative %: 48 % (ref 43–77)
Platelets: 282 K/uL (ref 150–400)
RBC: 4.29 MIL/uL (ref 3.87–5.11)
RDW: 13.7 % (ref 11.5–15.5)
WBC: 5.7 K/uL (ref 4.0–10.5)

## 2013-02-28 LAB — COMPLETE METABOLIC PANEL WITH GFR
BUN: 10 mg/dL (ref 6–23)
CO2: 28 mEq/L (ref 19–32)
Creat: 0.75 mg/dL (ref 0.50–1.10)
GFR, Est African American: >89 mL/min
GFR, Est Non African American: >89 mL/min
Glucose, Bld: 81 mg/dL (ref 70–99)
Total Bilirubin: 0.4 mg/dL (ref 0.3–1.2)
Total Protein: 7 g/dL (ref 6.0–8.3)

## 2013-02-28 LAB — LIPID PANEL
HDL: 64 mg/dL (ref >39)
Triglycerides: 67 mg/dL (ref <150)

## 2013-02-28 LAB — POCT GLYCOSYLATED HEMOGLOBIN (HGB A1C): Hemoglobin A1C: 5.3

## 2013-02-28 LAB — TSH: TSH: 2.111 u[IU]/mL (ref 0.350–4.500)

## 2013-02-28 LAB — GLUCOSE, CAPILLARY: Glucose-Capillary: 81 mg/dL (ref 70–99)

## 2013-02-28 NOTE — Patient Instructions (Addendum)
1. Follow up with Dr. Audley Hose in one month for annual exam 2. Will check you lab today.

## 2013-02-28 NOTE — Progress Notes (Signed)
Subjective:   Patient ID: Margaret Pacheco female   DOB: 14-Sep-1966 47 y.o.   MRN: [id number]  HPI: [name] is a 47 y.o. woman with PMH of PE ( 6 months coumadin), Asthma, and obesity, who presents to the clinic to re-establish the care. Last office visit in 2011.   Patient states that she is doing fine.  She doesn't have any complaints.  She is here for checkup. Please see my assessment and plan.  Past Medical History  Diagnosis Date  . Asthma   . Allergy    Current Outpatient Prescriptions  Medication Sig Dispense Refill  . ergocalciferol (VITAMIN D2) 50000 UNITS capsule Take 50,000 Units by mouth. Take one tablet po 3 times per week M,W,F as directed       . traZODone (DESYREL) 150 MG tablet Take 150 mg by mouth at bedtime.         No current facility-administered medications for this visit.   Family History  Problem Relation Age of Onset  . Heart disease Mother   . Cancer Father    History   Social History  . Marital Status: Single    Spouse Name: N/A    Number of Children: N/A  . Years of Education: N/A   Social History Main Topics  . Smoking status: Never Smoker   . Smokeless tobacco: None  . Alcohol Use: No  . Drug Use: No  . Sexually Active: None   Other Topics Concern  . 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   HEENT:  Denies congestion, sore throat, rhinorrhea, sneezing, mouth sores, trouble swallowing, neck pain   Respiratory:  Denies SOB, DOE, cough, and wheezing.   Cardiovascular:  Denies palpitations and leg swelling.   Gastrointestinal:  Denies nausea, vomiting, abdominal pain, diarrhea, constipation, blood in stool and abdominal distention.   Genitourinary:  Denies dysuria, urgency, frequency, hematuria, flank pain and difficulty  urinating.   Musculoskeletal:  Denies myalgias, back pain, joint swelling, arthralgias and gait problem.   Skin:  Denies pallor, rash and wound.   Neurological:  Denies dizziness, seizures, syncope, weakness, light-headedness, numbness and headaches.    .    Objective:  Physical Exam: Filed Vitals:   02/28/13 0831  BP: 122/76  Pulse: 73  Temp: 97.4 F (36.3 C)  TempSrc: Oral  Height: 5\' 5"  (1.651 m)  Weight: 185 lb 4.8 oz (84.052 kg)  SpO2: 100%   General: alert, well-developed, and cooperative to examination.  Head: normocephalic and atraumatic.  Eyes: vision grossly intact, pupils equal, pupils round, pupils reactive to light, no injection and anicteric.  Mouth: pharynx pink and moist, no erythema, and no exudates.  Neck: supple, full ROM, no thyromegaly, no JVD, and no carotid bruits.  Lungs: normal respiratory effort, no accessory muscle use, normal breath sounds, no crackles, and no wheezes. Heart: normal rate, regular rhythm, no murmur, no gallop, and no rub.  Abdomen: soft, non-tender, normal bowel sounds, no distention, no guarding, no rebound tenderness, no hepatomegaly, and no splenomegaly.  Msk: no joint swelling, no joint warmth, and no redness over joints.  Pulses: 2+ DP/PT pulses bilaterally Extremities: No cyanosis, clubbing, edema Neurologic: alert & oriented X3, cranial nerves II-XII intact, strength normal in all extremities, sensation intact to light touch, and gait normal.  Skin: turgor normal and no rashes.  Psych: Oriented X3, memory intact for recent and remote, normally interactive, good eye contact, not anxious appearing, and not depressed appearing.   Assessment & Plan:

## 2013-02-28 NOTE — Assessment & Plan Note (Signed)
Etiology was thought to be related to oral contraceptive.  This problem resolved. Completed 6 months of anticoagulation. Stopped coumadin on 04/23/09.   Plan:  --She has stopped all oral contraception. --I discussed in detail with her any red flag symptoms such leg pain, swelling, chest pain, SOB etc. -- She continues to be active at the gym and with school athletics.

## 2013-02-28 NOTE — Assessment & Plan Note (Addendum)
Margaret Pacheco is a 47 year old woman with PMH of PE, asthma and obesity who lost followups since 2011.  She's here today to reestablish her care.  She is doing well, and does not have any complaints. - Will obtain baseline labs--CMP, CBC, FLP, TSH, vitamin D, A1c. - She should followup with her primary care physician Dr. Genella Mech in one month.  A discussion with her PCP on Pap smear and mammogram is recommended during her follow up visit.

## 2013-02-28 NOTE — Assessment & Plan Note (Signed)
Patient was noted to have low vitamin D level, and was on oral vitamin D treatment in the past.  She states that she hasn't been taking any vitamin D supplement for years.   - Will check her vitamin D level

## 2013-02-28 NOTE — Progress Notes (Signed)
I discussed patient with resident Dr. Li, and I agree with the plans as outlined in her note. 

## 2013-03-01 LAB — VITAMIN D 25 HYDROXY (VIT D DEFICIENCY, FRACTURES): Vit D, 25-Hydroxy: 15 ng/mL — ABNORMAL LOW (ref 30–89)

## 2013-03-07 ENCOUNTER — Other Ambulatory Visit: Payer: Self-pay | Admitting: Internal Medicine

## 2013-03-07 DIAGNOSIS — E559 Vitamin D deficiency, unspecified: Secondary | ICD-10-CM

## 2013-03-25 ENCOUNTER — Other Ambulatory Visit: Payer: BC Managed Care – PPO

## 2013-08-18 ENCOUNTER — Other Ambulatory Visit: Payer: BC Managed Care – PPO

## 2013-09-05 NOTE — Addendum Note (Signed)
Addended by: Bufford Spikes on: 09/05/2013 09:46 AM   Modules accepted: Orders

## 2014-04-26 ENCOUNTER — Encounter (HOSPITAL_COMMUNITY): Payer: Self-pay

## 2015-01-30 ENCOUNTER — Emergency Department (HOSPITAL_COMMUNITY)
Admission: EM | Admit: 2015-01-30 | Discharge: 2015-01-30 | Disposition: A | Discharge status: Home / Self Care | Payer: BC Managed Care – PPO | Attending: Emergency Medicine | Admitting: Emergency Medicine

## 2015-01-30 ENCOUNTER — Emergency Department (HOSPITAL_COMMUNITY): Payer: BC Managed Care – PPO

## 2015-01-30 ENCOUNTER — Encounter (HOSPITAL_COMMUNITY): Payer: Self-pay

## 2015-01-30 ENCOUNTER — Other Ambulatory Visit: Payer: Self-pay

## 2015-01-30 DIAGNOSIS — Z880 Allergy status to penicillin: Secondary | ICD-10-CM | POA: Insufficient documentation

## 2015-01-30 DIAGNOSIS — J45909 Unspecified asthma, uncomplicated: Secondary | ICD-10-CM | POA: Diagnosis not present

## 2015-01-30 DIAGNOSIS — R5383 Other fatigue: Secondary | ICD-10-CM | POA: Insufficient documentation

## 2015-01-30 DIAGNOSIS — R0789 Other chest pain: Secondary | ICD-10-CM | POA: Diagnosis not present

## 2015-01-30 DIAGNOSIS — R079 Chest pain, unspecified: Secondary | ICD-10-CM

## 2015-01-30 LAB — BASIC METABOLIC PANEL
Anion gap: 11 (ref 5–15)
BUN: 11 mg/dL (ref 6–20)
CHLORIDE: 101 mmol/L (ref 101–111)
CO2: 30 mmol/L (ref 22–32)
Calcium: 9.2 mg/dL (ref 8.9–10.3)
Creatinine, Ser: 0.85 mg/dL (ref 0.44–1.00)
GFR calc Af Amer: >60 mL/min (ref >60)
GLUCOSE: 103 mg/dL — AB (ref 65–99)
POTASSIUM: 3.9 mmol/L (ref 3.5–5.1)
SODIUM: 142 mmol/L (ref 135–145)

## 2015-01-30 LAB — CBC
HCT: 42.9 % (ref 36.0–46.0)
HEMOGLOBIN: 14.8 g/dL (ref 12.0–15.0)
MCH: 31.6 pg (ref 26.0–34.0)
MCHC: 34.5 g/dL (ref 30.0–36.0)
MCV: 91.5 fL (ref 78.0–100.0)
PLATELETS: 333 10*3/uL (ref 150–400)
RBC: 4.69 MIL/uL (ref 3.87–5.11)
RDW: 12.3 % (ref 11.5–15.5)
WBC: 7.7 10*3/uL (ref 4.0–10.5)

## 2015-01-30 LAB — I-STAT TROPONIN, ED: Troponin i, poc: 0 ng/mL (ref 0.00–0.08)

## 2015-01-30 MED ORDER — IOHEXOL 350 MG/ML SOLN
80.0000 mL | Freq: Once | INTRAVENOUS | Status: AC | PRN
  Administered 2015-01-30: 80 mL via INTRAVENOUS

## 2015-01-30 NOTE — Discharge Instructions (Signed)
Return to the ED with any concerns including difficulty breathing, chest pain, fainting, changes in vision or speech, swelling of legs, weakness of arms or legs, decreased level of alertness/lethargy, or any other alarming symptoms

## 2015-01-30 NOTE — ED Notes (Signed)
Patient transported to X-ray 

## 2015-01-30 NOTE — ED Provider Notes (Signed)
CSN: [id number]     Arrival date & time 01/30/15  1611 History   First MD Initiated Contact with Patient 01/30/15 1641     Chief Complaint  Patient presents with  . Fatigue     (Consider location/radiation/quality/duration/timing/severity/associated sxs/prior Treatment) HPI  Pt presenting with c/o feeling fatigues, right sided upper chest pressure and tingling sensation in left arm.  She states she had her blood pressure checked and was told by a nurse that it was high- deoes not recall the number.  No difficulty breathing.  Symptoms not associated with exertion.  No leg swelling.  Pt does have hx of PE in the past- no longer on anticoagulants- completed treatment.  Pt states her symtpoms feel similar to prior PE.  There are no other associated systemic symptoms, there are no other alleviating or modifying factors.   Past Medical History  Diagnosis Date  . Asthma   . Allergy    No past surgical history on file. Family History  Problem Relation Age of Onset  . Heart disease Mother   . Cancer Father    History  Substance Use Topics  . Smoking status: Never Smoker   . Smokeless tobacco: Not on file  . Alcohol Use: No   OB History    No data available     Review of Systems  ROS reviewed and all otherwise negative except for mentioned in HPI    Allergies  Norethin-eth estrad triphasic and Penicillins  Home Medications   Prior to Admission medications   Medication Sig Start Date End Date Taking? Authorizing Provider  acyclovir (ZOVIRAX) 400 MG tablet Take 400 mg by mouth 3 (three) times daily. X10 01/23/15  Yes Historical Provider, MD   BP 105/82 mmHg  Pulse 73  Temp(Src) 97.8 F (36.6 C) (Oral)  Resp 12  Ht 5\' 5"  (1.651 m)  SpO2 100%  Vitals reviewed Physical Exam  Physical Examination: General appearance - alert, well appearing, and in no distress Mental status - alert, oriented to person, place, and time Eyes -  No conjunctival injection no scleral  icterus Mouth - mucous membranes moist, pharynx normal without lesions Chest - clear to auscultation, no wheezes, rales or rhonchi, symmetric air entry Heart - normal rate, regular rhythm, normal S1, S2, no murmurs, rubs, clicks or gallops Abdomen - soft, nontender, nondistended, no masses or organomegaly Neurological - awake and alert Extremities - peripheral pulses normal, no pedal edema, no clubbing or cyanosis Skin - normal coloration and turgor, no rashes  ED Course  Procedures (including critical care time)  5:34 PM have gone to see patient x 2, she remains in xray Labs Review Labs Reviewed  BASIC METABOLIC PANEL - Abnormal; Notable for the following:    Glucose, Bld 103 (*)    All other components within normal limits  CBC  I-STAT TROPOININ, ED    Imaging Review Dg Chest 2 View  01/30/2015   CLINICAL DATA:  Chest pain, left facial tingling  EXAM: CHEST  2 VIEW  COMPARISON:  02/08/2009  FINDINGS: Cardiomediastinal silhouette is stable. No acute infiltrate or pleural effusion. No pulmonary edema. Bony thorax is unremarkable.  IMPRESSION: No active cardiopulmonary disease.   Electronically Signed   By: Lahoma Crocker M.D.   On: 01/30/2015 17:22   Ct Angio Chest Pe W/cm &/or Wo Cm  01/30/2015   CLINICAL DATA:  Acute onset of left shoulder and arm tingling, and right chest pressure, now resolved. Fatigue. Initial encounter.  EXAM: CT ANGIOGRAPHY CHEST  WITH CONTRAST  TECHNIQUE: Multidetector CT imaging of the chest was performed using the standard protocol during bolus administration of intravenous contrast. Multiplanar CT image reconstructions and MIPs were obtained to evaluate the vascular anatomy.  CONTRAST:  29mL OMNIPAQUE IOHEXOL 350 MG/ML SOLN  COMPARISON:  Chest radiograph performed earlier today at 5:10 p.m., and CTA of the chest performed 02/08/2009  FINDINGS: There is no evidence of pulmonary embolus.  The lungs are clear bilaterally. There is no evidence of significant focal  consolidation, pleural effusion or pneumothorax. No masses are identified; no abnormal focal contrast enhancement is seen.  The mediastinum is unremarkable in appearance. No mediastinal lymphadenopathy is seen. No pericardial effusion is identified. The great vessels are grossly unremarkable in appearance. No axillary lymphadenopathy is seen. The visualized portions of the thyroid gland are unremarkable in appearance.  The visualized portions of the liver and spleen are unremarkable. The visualized portions of the pancreas, gallbladder, stomach, adrenal glands and kidneys are within normal limits.  No acute osseous abnormalities are seen.  Review of the MIP images confirms the above findings.  IMPRESSION: 1. No evidence of pulmonary embolus. 2. Lungs clear bilaterally.   Electronically Signed   By: Garald Balding M.D.   On: 01/30/2015 20:03     EKG Interpretation   Date/Time:  Tuesday Jan 30 2015 16:33:25 EDT Ventricular Rate:  76 PR Interval:  116 QRS Duration: 66 QT Interval:  400 QTC Calculation: 450 R Axis:   82 Text Interpretation:  Normal sinus rhythm Normal ECG Since previous  tracing t wave abnormality is less pronounced Confirmed by Canary Brim  MD,  Ladora Osterberg [phone]) on 01/30/2015 5:48:09 PM      MDM   Final diagnoses:  Chest pain  Other fatigue    Pt presenting with c/o right sided chest pressure, arm tingling, concern for high blood pressure and fatigue.  ekg reassuring, negative troponin. Blood pressure not significantly elevated in the ED.  Due to hx of PE, CT angio performed which was negative.  Discharged with strict return precautions.  Pt agreeable with plan.    Alfonzo Beers, MD 01/31/15 [id number]

## 2015-01-30 NOTE — ED Notes (Signed)
Pt reports around 12 noon left shoulder and arm tingling and right chest pressure.  No symptoms at this time.  Pt reports feeling fatigued.

## 2015-01-30 NOTE — ED Notes (Signed)
CT called to inquire when pt can be scanned.

## 2015-02-01 ENCOUNTER — Telehealth: Payer: Self-pay | Admitting: Internal Medicine

## 2015-02-01 NOTE — Telephone Encounter (Signed)
Call to patient to confirm appointment for 02/02/15 at 3:15 lmtcb

## 2015-02-02 ENCOUNTER — Encounter: Payer: BC Managed Care – PPO | Admitting: Internal Medicine

## 2015-02-07 ENCOUNTER — Telehealth: Payer: Self-pay | Admitting: Internal Medicine

## 2015-02-07 NOTE — Telephone Encounter (Signed)
Call to patient to confirm appointment for 02/08/15 at 3:15 lmtcb

## 2015-02-08 ENCOUNTER — Encounter: Payer: Self-pay | Admitting: Internal Medicine

## 2015-02-08 ENCOUNTER — Ambulatory Visit (INDEPENDENT_AMBULATORY_CARE_PROVIDER_SITE_OTHER): Payer: BC Managed Care – PPO | Admitting: Internal Medicine

## 2015-02-08 VITALS — BP 133/79 | HR 81 | Temp 98.2°F | Ht 65.0 in | Wt 198.4 lb

## 2015-02-08 DIAGNOSIS — Z09 Encounter for follow-up examination after completed treatment for conditions other than malignant neoplasm: Secondary | ICD-10-CM

## 2015-02-08 NOTE — Patient Instructions (Signed)
General Instructions:   Please try to bring all your medicines next time. This will help [place] keep you safe from mistakes.   Your symptoms maybe as a result of the medication you had been taking. All your tests were normal and your risk of any heart problems is very low. If any symptoms return make an appt right away to be seen.    Progress Toward Treatment Goals:  No flowsheet data found.  Self Care Goals & Plans:  No flowsheet data found.  No flowsheet data found.   Care Management & Community Referrals:  No flowsheet data found.

## 2015-02-08 NOTE — Progress Notes (Signed)
   Subjective:   Patient ID: Margaret Pacheco female   DOB: 1966/01/05 49 y.o.   MRN: [id number]  HPI: Margaret Pacheco is a 49 y.o. woman pmh as listed below presents for ED follow up.   Patient was seen in the ED on 01/30/15 for fatigue and right sided chest pressure. The patient had EKG, troponins and a CT angiogram that were all negative. Since that time the patient has felt fine without any complaints. She has had no return of any of her symptoms. She denied any repeat CP, SOB, DOE, LE edema, palpitations, HA, blurry vision, presyncope or syncope. She did state that she had been given some medicine by an UC for a breakout on her face and ever since she discontinued it she has had no problems. She is a non-smoker.    Past Medical History  Diagnosis Date  . Asthma   . Allergy    Current Outpatient Prescriptions  Medication Sig Dispense Refill  . acyclovir (ZOVIRAX) 400 MG tablet Take 400 mg by mouth 3 (three) times daily. X10  0   No current facility-administered medications for this visit.   Family History  Problem Relation Age of Onset  . Heart disease Mother   . Cancer Father    History   Social History  . Marital Status: Single    Spouse Name: N/A  . Number of Children: N/A  . Years of Education: N/A   Social History Main Topics  . Smoking status: Never Smoker   . Smokeless tobacco: Not on file  . Alcohol Use: No  . Drug Use: No  . Sexual Activity: Not on file   Other Topics Concern  . 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 Objective:  Physical Exam: Filed Vitals:   02/08/15 1608  BP: 133/79  Pulse: 81  Temp: 98.2 F (36.8 C)  Weight: 198 lb 6.4 oz (89.994 kg)  SpO2: 100%   General: sitting in chair, NAD Cardiac: RRR, no rubs, murmurs or gallops Pulm: clear to auscultation bilaterally, no crackles, wheezes or  rhonchi, moving normal volumes of air Abd: soft, nontender, nondistended, BS present Ext: warm and well perfused, no pedal edema, no palpable cords   Assessment & Plan:  Please see problem oriented charting  Pt discussed with Dr. Lynnae January

## 2015-02-08 NOTE — Assessment & Plan Note (Signed)
Patient is following up after ED visit for some atypical chest pain. The patient stated that these symptoms started after starting acyclovir and what appears to be prednisone that she received from an urgent care center when she had presumed zosters outbreak. She discontinued that medication after that day and has not had any return of her symptoms. The has a very low ASCVD score and no other signs or symptoms for concern of cardiac etiology. CTA was negative. Therefore at this time no need for stress testing or holter monitoring.  -f/u as needed.  Regular care in 2 years

## 2015-02-09 NOTE — Progress Notes (Signed)
Internal Medicine Clinic Attending  Case discussed with Dr. Sadek soon after the resident saw the patient.  We reviewed the resident's history and exam and pertinent patient test results.  I agree with the assessment, diagnosis, and plan of care documented in the resident's note. 

## 2015-05-24 ENCOUNTER — Telehealth: Payer: Self-pay | Admitting: Internal Medicine

## 2015-05-24 NOTE — Telephone Encounter (Signed)
Call to patient to confirm appointment for 05/25/15 at 3:45 lmtcb

## 2015-05-25 ENCOUNTER — Ambulatory Visit (INDEPENDENT_AMBULATORY_CARE_PROVIDER_SITE_OTHER): Payer: BC Managed Care – PPO | Admitting: Internal Medicine

## 2015-05-25 ENCOUNTER — Encounter: Payer: Self-pay | Admitting: Internal Medicine

## 2015-05-25 VITALS — BP 133/75 | HR 65 | Temp 97.7°F | Ht 65.0 in | Wt 200.6 lb

## 2015-05-25 DIAGNOSIS — Z021 Encounter for pre-employment examination: Secondary | ICD-10-CM

## 2015-05-25 DIAGNOSIS — Z09 Encounter for follow-up examination after completed treatment for conditions other than malignant neoplasm: Secondary | ICD-10-CM

## 2015-05-25 NOTE — Progress Notes (Signed)
Patient ID: Ballard Russell, female   DOB: 1966-06-18, 49 y.o.   MRN: [id number]   Subjective:   Patient ID: COOPER STAMP female   DOB: [birth year] 49 y.o.   MRN: [id number]  HPI: Ms.Kaelene AUNICA DAUPHINEE is a 49 y.o. PMH listed below. Presented today to request for a form to befilled that would certify that she has no limitations to work, in [place]. Otherwise she has no complains today.  Past Medical History  Diagnosis Date  . Asthma   . Allergy    Current Outpatient Prescriptions  Medication Sig Dispense Refill  . acyclovir (ZOVIRAX) 400 MG tablet Take 400 mg by mouth 3 (three) times daily. X10  0   No current facility-administered medications for this visit.   Family History  Problem Relation Age of Onset  . Heart disease Mother   . Cancer Father    Social History   Social History  . Marital Status: Single    Spouse Name: N/A  . Number of Children: N/A  . Years of Education: N/A   Social History Main Topics  . Smoking status: Never Smoker   . Smokeless tobacco: None  . Alcohol Use: No  . Drug Use: No  . Sexual Activity: Not Asked   Other Topics Concern  . 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 SKIN- No Rash, colour changes or itching. HEAD- No Headache or dizziness. EYES- No Vision loss, double or blurred vision. EARS- No vertigo, hearing loss or ear discharge. RESPIRATORY- No Cough or SOB. CARDIAC- No chest pain. GI- No  vomiting, diarrhoea, abd pain. URINARY- No Frequency, or dysuria.  Objective:  Physical Exam: Filed Vitals:   05/25/15 1611  BP: 133/75  Pulse: 65  Temp: 97.7 F (36.5 C)  TempSrc: Oral  Height: 5\' 5"  (1.651 m)  Weight: 200 lb 9.6 oz (90.992 kg)  SpO2: 100%   GENERAL- alert, co-operative, appears as stated age, not in any distress. HEENT- Atraumatic, normocephalic,  PERRL, CARDIAC- RRR, no murmurs, rubs or gallops. RESP- Moving equal volumes of air, and clear to auscultation bilaterally, no wheezes or crackles. ABDOMEN- Soft, nontender NEURO- Moving all extremities, gait normal. EXTREMITIES- no pedal edema. SKIN- Warm, dry, No rash or lesion. PSYCH- Normal mood and affect, appropriate thought content and speech.  Assessment & Plan:   The patient's case and plan of care was discussed with attending physician, Dr.Vincent.   Please see problem based charting for assessment and plan.

## 2015-05-26 NOTE — Assessment & Plan Note (Addendum)
Vision tested- Bilat- vision- 20/15. Hearing appears intact. Vitals WNL. Medical hx- PE- 2010, treated with coumadin, completed 6 months anticoag. She will get her flu shot at work- where she will get it free. She has had the TB skin test done and read at the health department, she will pi up the report. Form filled and signed.

## 2015-05-28 NOTE — Progress Notes (Signed)
Internal Medicine Clinic Attending  Case discussed with Dr. Emokpae soon after the resident saw the patient.  We reviewed the resident's history and exam and pertinent patient test results.  I agree with the assessment, diagnosis, and plan of care documented in the resident's note. 

## 2015-09-04 ENCOUNTER — Encounter: Payer: BC Managed Care – PPO | Admitting: Internal Medicine

## 2015-09-12 ENCOUNTER — Encounter: Payer: Self-pay | Admitting: Internal Medicine

## 2015-09-12 ENCOUNTER — Ambulatory Visit (HOSPITAL_COMMUNITY)
Admission: RE | Admit: 2015-09-12 | Discharge: 2015-09-12 | Disposition: A | Discharge status: Home / Self Care | Payer: BC Managed Care – PPO | Source: Ambulatory Visit | Attending: Internal Medicine | Admitting: Internal Medicine

## 2015-09-12 ENCOUNTER — Ambulatory Visit (INDEPENDENT_AMBULATORY_CARE_PROVIDER_SITE_OTHER): Payer: BC Managed Care – PPO | Admitting: Internal Medicine

## 2015-09-12 VITALS — BP 110/66 | HR 72 | Temp 97.6°F | Ht 65.0 in | Wt 203.3 lb

## 2015-09-12 DIAGNOSIS — Z23 Encounter for immunization: Secondary | ICD-10-CM

## 2015-09-12 DIAGNOSIS — M25562 Pain in left knee: Secondary | ICD-10-CM | POA: Diagnosis not present

## 2015-09-12 DIAGNOSIS — M25469 Effusion, unspecified knee: Secondary | ICD-10-CM | POA: Insufficient documentation

## 2015-09-12 DIAGNOSIS — M7989 Other specified soft tissue disorders: Secondary | ICD-10-CM | POA: Diagnosis not present

## 2015-09-12 DIAGNOSIS — M25462 Effusion, left knee: Secondary | ICD-10-CM | POA: Diagnosis not present

## 2015-09-12 NOTE — Progress Notes (Signed)
   Subjective:    Patient ID: Margaret Pacheco, female    DOB: 07/06/1966, 49 y.o.   MRN: [id number]  HPI Comments: Margaret Pacheco is a 49 year old woman with PMH as below.  She says she is here for "follow-up."   Also has subacute c/o left knee swelling.  Please see problem based charting for status of this condition.  Knee Pain  There was no injury mechanism. The pain is present in the left knee. The pain is at a severity of 0/10. The patient is experiencing no pain. Pertinent negatives include no inability to bear weight. Nothing aggravates the symptoms. She has tried ice and NSAIDs for the symptoms. The treatment provided no relief.  Knee sometimes feels like it will give out.   Working out at gym (bike, eliptical, treadmill) 3-4 x per week.  Past Medical History  Diagnosis Date  . Asthma   . Allergy    No current outpatient prescriptions on file prior to visit.   No current facility-administered medications on file prior to visit.    Review of Systems  Constitutional: Positive for unexpected weight change. Negative for fever, chills, appetite change and fatigue.       Weight gain.  Respiratory: Negative for shortness of breath.   Cardiovascular: Negative for chest pain, palpitations and leg swelling.  Gastrointestinal: Negative for nausea, vomiting, abdominal pain, diarrhea and constipation.  Endocrine: Negative for cold intolerance and heat intolerance.  Genitourinary: Negative for difficulty urinating.  Musculoskeletal:       Left knee swelling x 5 weeks but no pain.  Skin:       No dry skin.   Neurological: Negative for syncope and light-headedness.       Filed Vitals:   09/12/15 1423  BP: 110/66  Pulse: 72  Temp: 97.6 F (36.4 C)  TempSrc: Oral  Height: 5\' 5"  (1.651 m)  Weight: 203 lb 4.8 oz (92.216 kg)  SpO2: 100%    Objective:   Physical Exam  Constitutional: She is oriented to person, place, and time. She appears well-developed. No distress.  HENT:  Head:  Normocephalic and atraumatic.  Mouth/Throat: Oropharynx is clear and moist. No oropharyngeal exudate.  Eyes: EOM are normal. Pupils are equal, round, and reactive to light.  Neck: Neck supple.  Cardiovascular: Normal rate, regular rhythm and normal heart sounds.  Exam reveals no gallop and no friction rub.   No murmur heard. Pulmonary/Chest: Effort normal and breath sounds normal. No respiratory distress. She has no wheezes. She has no rales.  Abdominal: Soft. Bowel sounds are normal. She exhibits no distension and no mass. There is no tenderness. There is no rebound and no guarding.  Musculoskeletal: Normal range of motion. She exhibits edema and tenderness.  Left knee with small effusion.  No redness or significant warmth.  Medial and lateral joint line TTP.  Neg valgus/varus, neg anterior/posterior drawer.  Good ROM.  + McMurray's.  Neurological: She is alert and oriented to person, place, and time. No cranial nerve deficit.  Skin: Skin is warm. She is not diaphoretic.  Psychiatric: She has a normal mood and affect. Her behavior is normal. Judgment and thought content normal.          Assessment & Plan:  Please see problem based charting for A&P.

## 2015-09-12 NOTE — Addendum Note (Signed)
Addended by: Gilles Chiquito B on: 09/12/2015 10:46 PM   Modules accepted: Level of Service

## 2015-09-12 NOTE — Assessment & Plan Note (Addendum)
Left knee swelling > 4 weeks (since before Thanksgiving).  She works out 4 times per week but does not remember specific injury.  Occasionally the knee will feel like it is going to give out and may feel uncomfortable but no pain.  No fevers or chills and otherwise feels fine.  No prior hx of trauma to left knee.  Good ROM on exam.  Small effusion.  No redness or warmth.  Neg anterior/posterior drawer tests.  Neg valgus/varus stress. The is medial and lateral joint pain and a click with McMurray's.  Give + McMurray's and swelling, I am concerned for meniscal tear. Plan:  Xray to look for bony abnormality or joint space narrowing.  MRI to look for ligamentous or meniscal tear.  Recommend rest, ice, compression, prn NSAID.

## 2015-09-12 NOTE — Patient Instructions (Signed)
1. I am sending you for an xray and mri of your knee to help figure out what is causing your knee swelling.  Please try ice, NSAID as needed and knee brace to help with swelling.  Try to rest the knee when you can.   2. Please take all medications as prescribed.    3. If you have worsening of your symptoms or new symptoms arise, please call the clinic [phone]), or go to the ER immediately if symptoms are severe.    Meniscus Tear A meniscus tear is a knee injury in which a piece of the meniscus is torn. The meniscus is a thick, rubbery, wedge-shaped cartilage in the knee. Two menisci are located in each knee. They sit between the upper bone (femur) and lower bone (tibia) that make up the knee joint. Each meniscus acts as a shock absorber for the knee. A torn meniscus is one of the most common types of knee injuries. This injury can range from mild to severe. Surgery may be needed for a severe tear. CAUSES This injury may be caused by any squatting, twisting, or pivoting movement. Sports-related injuries are the most common cause. These often occur from:  Running and stopping suddenly.  Changing direction.  Being tackled or knocked off your feet. As people get older, their meniscus gets thinner and weaker. In these people, tears can happen more easily, such as from climbing stairs.  RISK FACTORS This injury is more likely to happen to:  People who play contact sports.  Males.  People who are 22-38 years of age. SYMPTOMS  Symptoms of this injury include:  Knee pain, especially at the side of the knee joint. You may feel pain when the injury occurs, or you may only hear a pop and feel pain later.  A feeling that your knee is clicking, catching, locking, or giving way.  Not being able to fully bend or extend your knee.  Bruising or swelling in your knee. DIAGNOSIS  This injury may be diagnosed based on your symptoms and a physical exam. The physical exam may include:  Moving  your knee in different ways.  Feeling for tenderness.  Listening for a clicking sound.  Checking if your knee locks or catches. You may also have tests, such as:  X-rays.  MRI.  A procedure to look inside your knee with a narrow surgical telescope (arthroscopy). You may be referred to a knee specialist (orthopedic surgeon). TREATMENT  Treatment for this injury depends on the severity of the tear. Treatment for a mild tear may include:  Rest.  Medicine to reduce pain and swelling. This is usually a nonsteroidal anti-inflammatory drug (NSAID).  A knee brace or an elastic sleeve or wrap.  Using crutches or a walker to keep weight off your knee and to help you walk.  Exercises to strengthen your knee (physical therapy). You may need surgery if you have a severe tear or if other treatments are not working.  HOME CARE INSTRUCTIONS Managing Pain and Swelling  Take over-the-counter and prescription medicines only as told by your health care provider.  If directed, apply ice to the injured area:  Put ice in a plastic bag.  Place a towel between your skin and the bag.  Leave the ice on for 20 minutes, 2-3 times per day.  Raise (elevate) the injured area above the level of your heart while you are sitting or lying down. Activity  Do not use the injured limb to support your body weight  until your health care provider says that you can. Use crutches or a walker as told by your health care provider.  Return to your normal activities as told by your health care provider. Ask your health care provider what activities are safe for you.  Perform range-of-motion exercises only as told by your health care provider.  Begin doing exercises to strengthen your knee and leg muscles only as told by your health care provider. After you recover, your health care provider may recommend these exercises to help prevent another injury. General Instructions  Use a knee brace or elastic wrap as  told by your health care provider.  Keep all follow-up visits as told by your health care provider. This is important. SEEK MEDICAL CARE IF:  You have a fever.  Your knee becomes red, tender, or swollen.  Your pain medicine is not helping.  Your symptoms get worse or do not improve after 2 weeks of home care.   This information is not intended to replace advice given to you by your health care provider. Make sure you discuss any questions you have with your health care provider.   Document Released: 11/22/2002 Document Revised: 05/23/2015 Document Reviewed: 12/25/2014 Elsevier Interactive Patient Education [employer].

## 2015-09-12 NOTE — Progress Notes (Signed)
Internal Medicine Clinic Attending  Case discussed with Dr. Wilson soon after the resident saw the patient.  We reviewed the resident's history and exam and pertinent patient test results.  I agree with the assessment, diagnosis, and plan of care documented in the resident's note.  

## 2015-09-25 ENCOUNTER — Telehealth: Payer: Self-pay | Admitting: *Deleted

## 2015-09-25 NOTE — Telephone Encounter (Signed)
Call to patient with appointment for MRI scheduled for 10/09/2015 at 12:00 Noon with arrival time of 11:45 AM.  Message left on patient's voicemail with time and date of appointment and location. Patient was also given the number [phone] to reschedule if needed.  Sander Nephew, RN 09/25/2015 2:53 PM

## 2015-10-09 ENCOUNTER — Ambulatory Visit (HOSPITAL_COMMUNITY): Admission: RE | Admit: 2015-10-09 | Payer: BC Managed Care – PPO | Source: Ambulatory Visit

## 2015-10-23 ENCOUNTER — Encounter (INDEPENDENT_AMBULATORY_CARE_PROVIDER_SITE_OTHER): Payer: Self-pay

## 2015-10-23 ENCOUNTER — Ambulatory Visit (HOSPITAL_COMMUNITY)
Admission: RE | Admit: 2015-10-23 | Discharge: 2015-10-23 | Disposition: A | Discharge status: Home / Self Care | Payer: BC Managed Care – PPO | Source: Ambulatory Visit | Attending: Internal Medicine | Admitting: Internal Medicine

## 2015-10-23 DIAGNOSIS — M25562 Pain in left knee: Secondary | ICD-10-CM | POA: Insufficient documentation

## 2015-10-23 DIAGNOSIS — M7122 Synovial cyst of popliteal space [Baker], left knee: Secondary | ICD-10-CM | POA: Diagnosis not present

## 2015-10-23 DIAGNOSIS — S83512A Sprain of anterior cruciate ligament of left knee, initial encounter: Secondary | ICD-10-CM | POA: Diagnosis not present

## 2015-10-23 DIAGNOSIS — M25462 Effusion, left knee: Secondary | ICD-10-CM | POA: Diagnosis present

## 2015-10-23 DIAGNOSIS — X58XXXA Exposure to other specified factors, initial encounter: Secondary | ICD-10-CM | POA: Insufficient documentation

## 2015-10-23 DIAGNOSIS — D1622 Benign neoplasm of long bones of left lower limb: Secondary | ICD-10-CM | POA: Diagnosis not present

## 2015-10-24 ENCOUNTER — Other Ambulatory Visit: Payer: Self-pay | Admitting: Internal Medicine

## 2015-10-24 DIAGNOSIS — S83249S Other tear of medial meniscus, current injury, unspecified knee, sequela: Secondary | ICD-10-CM

## 2015-10-24 DIAGNOSIS — S83242D Other tear of medial meniscus, current injury, left knee, subsequent encounter: Secondary | ICD-10-CM

## 2015-12-10 ENCOUNTER — Telehealth: Payer: Self-pay | Admitting: Internal Medicine

## 2015-12-10 NOTE — Telephone Encounter (Signed)
APPT. REMINDER CALL, LMTCB °

## 2015-12-11 ENCOUNTER — Encounter: Payer: Self-pay | Admitting: Internal Medicine

## 2015-12-11 ENCOUNTER — Ambulatory Visit (INDEPENDENT_AMBULATORY_CARE_PROVIDER_SITE_OTHER): Payer: BC Managed Care – PPO | Admitting: Internal Medicine

## 2015-12-11 VITALS — BP 138/90 | HR 62 | Temp 97.8°F | Ht 65.0 in | Wt 201.0 lb

## 2015-12-11 DIAGNOSIS — E669 Obesity, unspecified: Secondary | ICD-10-CM | POA: Diagnosis not present

## 2015-12-11 DIAGNOSIS — Z86711 Personal history of pulmonary embolism: Secondary | ICD-10-CM | POA: Diagnosis not present

## 2015-12-11 DIAGNOSIS — N951 Menopausal and female climacteric states: Secondary | ICD-10-CM | POA: Diagnosis not present

## 2015-12-11 DIAGNOSIS — Z6833 Body mass index (BMI) 33.0-33.9, adult: Secondary | ICD-10-CM

## 2015-12-11 DIAGNOSIS — E66811 Obesity, class 1: Secondary | ICD-10-CM | POA: Insufficient documentation

## 2015-12-11 NOTE — Patient Instructions (Signed)
Please continue your work out regimen.  Keep eating healthy. Drink more water.  See Barry Brunner for meal planning.  Try to work through your menopausal symptoms (hot flash, irritation).  If it's too bothersome please let us know. We can try medications other than estrogen which sometimes do cause weight gain so we don't want to start that right now.

## 2015-12-11 NOTE — Assessment & Plan Note (Signed)
Per report by patient she had uterine wall ablation for DUB (hard for me to confirm this as data is not coming up from [place] [facility]). No periods for years since that procedure. Now having hot flashes and easy irritability for last few months. I suspect this is likely peri or post menopausal related. No fever or other signs of infection.  Asked her to continue to cope with this with exercise and mindfulness techniques. I recommended against any estrogen replacement with her hx of PE. Would not recommend SSRI either since that can cause weight gain.  -f/up as needed for this.

## 2015-12-11 NOTE — Progress Notes (Signed)
   Subjective:    Patient ID: Margaret Pacheco, female    DOB: 1966/01/10, 50 y.o.   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   Patient states she gained 30 lbs within last few months. But based on our record: Patient's BMI is 33.45, weight today 201 lb. Was 185 in 2014, but has been around 200 on average for last few months. She is doing mix of cardio and resistance workout 6x a week for 1 hour on average. Eating appropriate diet (yogurt for breakfast, snacking with small amt of chex mix, salad for lunch, lean meat at dinner). Not drinking much water.   Having hot flashes for last few months. Had uterine wall ablation per patient (can't find the record) for DUB few years ago. No periods since then. Having easy irritability too.   No other complaints.     Review of Systems  Constitutional: Negative for fever and chills.       Hot flashes  HENT: Negative for congestion and sore throat.   Eyes: Negative for photophobia and visual disturbance.  Respiratory: Negative for chest tightness, shortness of breath and wheezing.   Cardiovascular: Negative for chest pain, palpitations and leg swelling.  Gastrointestinal: Negative for abdominal pain and abdominal distention.  Endocrine: Negative.   Genitourinary: Negative for dysuria and flank pain.  Musculoskeletal: Negative.   Skin: Negative.   Allergic/Immunologic: Negative.   Neurological: Negative for dizziness, weakness and headaches.  Psychiatric/Behavioral: Negative.        Objective:   Physical Exam  Constitutional: She is oriented to person, place, and time. She appears well-developed and well-nourished. No distress.  HENT:  Head: Normocephalic and atraumatic.  Eyes: Conjunctivae are normal. Pupils are equal, round, and reactive to light.  Neck: Normal  range of motion.  Cardiovascular: Normal rate and regular rhythm.  Exam reveals no gallop and no friction rub.   No murmur heard. Pulmonary/Chest: Effort normal and breath sounds normal. No respiratory distress. She has no wheezes.  Abdominal: Soft. Bowel sounds are normal. She exhibits no distension. There is no tenderness.  Musculoskeletal: Normal range of motion. She exhibits no edema or tenderness.  Neurological: She is alert and oriented to person, place, and time. No cranial nerve deficit.  Skin: She is not diaphoretic.    Filed Vitals:   12/11/15 0820  BP: 138/90  Pulse: 62  Temp: 97.8 F (36.6 C)       Assessment & Plan:  See problem based a&p.

## 2015-12-11 NOTE — Assessment & Plan Note (Signed)
Patient is trying diligently to lose weight by exercising 6x a week and eating overall healthy diet. I confirmed that she gained about 15 lb since 2014. But overall she appears to be muscular in appearance. I believe the weight gain may be partially from gain of muscle mass. Based on her report, I don't suspect we can change much in her diet. However, we will refer to medical nutritionist to help her with meal planning further. I suggested cutting down on dry fruit (for high sugar content).  - encouraged continuing exercise regimen. - would not recommend any weight loss meds yet, patient agrees to this.  - f/up with PCP if she tries every thing and still has no success may discuss weight loss meds (I told her most of them has side effects or are expensive and the weight loss is not that impressive with them - absolute weight loss about 2-3% compared to placebo).

## 2015-12-12 NOTE — Progress Notes (Signed)
Internal Medicine Clinic Attending  Case discussed with Dr. Ahmed at the time of the visit.  We reviewed the resident's history and exam and pertinent patient test results.  I agree with the assessment, diagnosis, and plan of care documented in the resident's note. 

## 2015-12-24 ENCOUNTER — Encounter: Payer: Self-pay | Admitting: *Deleted

## 2015-12-31 ENCOUNTER — Telehealth: Payer: Self-pay | Admitting: *Deleted

## 2015-12-31 NOTE — Telephone Encounter (Signed)
She can make an appt to discuss this. We may check that if appropriate.

## 2015-12-31 NOTE — Telephone Encounter (Signed)
Spoke with patient who said that she is still having the hot flash symptoms and the weight gain.  Patient mentioned that her mother ans sister and other family members have had problems with there Thyroid.  Was asking if she might be able to get her Thyroid checked.

## 2016-01-14 ENCOUNTER — Telehealth: Payer: Self-pay | Admitting: Internal Medicine

## 2016-01-14 NOTE — Telephone Encounter (Signed)
APPT. REMINDER CALL, LMTCB °

## 2016-01-15 ENCOUNTER — Encounter: Payer: Self-pay | Admitting: Internal Medicine

## 2016-01-15 ENCOUNTER — Ambulatory Visit (INDEPENDENT_AMBULATORY_CARE_PROVIDER_SITE_OTHER): Payer: BC Managed Care – PPO | Admitting: Internal Medicine

## 2016-01-15 VITALS — BP 155/88 | HR 74 | Temp 98.4°F | Ht 65.0 in | Wt 203.6 lb

## 2016-01-15 DIAGNOSIS — Z86711 Personal history of pulmonary embolism: Secondary | ICD-10-CM | POA: Diagnosis not present

## 2016-01-15 DIAGNOSIS — R030 Elevated blood-pressure reading, without diagnosis of hypertension: Secondary | ICD-10-CM | POA: Diagnosis not present

## 2016-01-15 DIAGNOSIS — Z6833 Body mass index (BMI) 33.0-33.9, adult: Secondary | ICD-10-CM

## 2016-01-15 DIAGNOSIS — E559 Vitamin D deficiency, unspecified: Secondary | ICD-10-CM | POA: Diagnosis not present

## 2016-01-15 DIAGNOSIS — R350 Frequency of micturition: Secondary | ICD-10-CM

## 2016-01-15 DIAGNOSIS — Z1231 Encounter for screening mammogram for malignant neoplasm of breast: Secondary | ICD-10-CM

## 2016-01-15 DIAGNOSIS — IMO0001 Reserved for inherently not codable concepts without codable children: Secondary | ICD-10-CM

## 2016-01-15 DIAGNOSIS — N951 Menopausal and female climacteric states: Secondary | ICD-10-CM | POA: Diagnosis not present

## 2016-01-15 DIAGNOSIS — E669 Obesity, unspecified: Secondary | ICD-10-CM | POA: Diagnosis not present

## 2016-01-15 DIAGNOSIS — I10 Essential (primary) hypertension: Secondary | ICD-10-CM | POA: Insufficient documentation

## 2016-01-15 DIAGNOSIS — Z0000 Encounter for general adult medical examination without abnormal findings: Secondary | ICD-10-CM | POA: Insufficient documentation

## 2016-01-15 NOTE — Assessment & Plan Note (Signed)
Will recheck level today since has hx of vitamin D deficiency and currently not on therapy.

## 2016-01-15 NOTE — Assessment & Plan Note (Signed)
BP mildly elevated today. No prior hx of HTN. I'm going to check a bmet today to make sure she does not have renal disease or hyper/hypokalemia to suggest a secondary cause for hypertension. If BP still elevated at next visit, may need to consider adding on an antihypertensive.

## 2016-01-15 NOTE — Progress Notes (Signed)
Case discussed with Dr. Rivet soon after the resident saw the patient. We reviewed the resident's history and exam and pertinent patient test results. I agree with the assessment, diagnosis, and plan of care documented in the resident's note. 

## 2016-01-15 NOTE — Progress Notes (Signed)
   Subjective:    Patient ID: Margaret Pacheco, female    DOB: Jul 21, 1966, 50 y.o.   MRN: [id number]  HPI 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  Hot flashes/Weight gain: She was seen in the clinic on 12/11/15 where she reported hot flashes for the last few months. She also noted a 30 lb weight gain at that time despite weight appearing stable for the last year per our records. Today, she reports getting hot flashes since October 2016 and they occur mostly at night. She reports she had an ablation done in 2009 and had her ovaries removed. She has not had a menstrual period since that time. She reports the hot flashes do not bother her that much. She has been wearing light, loose-fitting clothing to help. She is more concerned about her weight gain. She is frustrated because she is going to the gym 6 times per week and eating healthy and still noticing weight gain. She is concerned she may have a thyroid problem. Both her mother and sister have thyroid problems. She reports dry skin (unchanged), fatigue, and irritability. She denies diarrhea, constipation, anxiety, or changes in her skin, hair, or nails.   Hx Vitamin D Deficiency: Her Vit D level was 15 in June 2014. She was prescribed Ergocalciferol 50,000 units in May 2016 but unclear if she took this medication. She does not recall taking it.   Elevated BP: BP 155/88 today. Repeat BP was 145/80. Patient denies any history of high blood pressure. Denies a high salt diet.   Urinary Frequency: She reports over the last month she is having urinary frequency. She denies dysuria and hematuria. She thinks she is drinking about the same amount of water/fluids. She denies any fevers, chills, abdominal pain, and back pain.    Review of Systems General: Denies fever, chills, night sweats, changes in appetite HEENT: Denies headaches, ear  pain, changes in vision, rhinorrhea, sore throat CV: Denies CP, palpitations, SOB, orthopnea Pulm: Denies SOB, cough, wheezing GI: Denies nausea, vomiting, diarrhea, constipation, melena, hematochezia GU: See HPI Msk: Denies muscle cramps, joint pains Neuro: Denies weakness, numbness, tingling Skin: Denies rashes, bruising Psych: Denies depression, anxiety, hallucinations    Objective:   Physical Exam General: alert, sitting up, NAD HEENT: [place]/AT, EOMI, sclera anicteric, mucus membranes moist Neck: supple, no thyromegaly or lymphadenopathy appreciated CV: RRR, no m/g/r Pulm: CTA bilaterally, breaths non-labored Abd: BS+, soft, non-tender Ext: warm, no peripheral edema Neuro: alert and oriented x 3     Assessment & Plan:  Please refer to A&P documentation.

## 2016-01-15 NOTE — Assessment & Plan Note (Signed)
Her hot flashes are most likely perimenopausal related. She is not a good candidate for estrogen therapy with her history of PE. I will check a [place] level to confirm the diagnosis. In the meantime since her symptoms do not bother her much, I recommended conservative measures such as wearing light, loose-fitting clothing, turning on fans/AC, and avoiding spicy foods.

## 2016-01-15 NOTE — Patient Instructions (Signed)
  General Instructions: - Continue conservative measures for hot flashes: wear loose-fitting and light clothing, turn on fans/AC, avoid spicy foods - We will check thyroid, kidney, electrolytes, and vitamin D labs today - Urine test today as well - We will schedule your mammogram for you   Please bring your medicines with you each time you come to clinic.  Medicines may include prescription medications, over-the-counter medications, herbal remedies, eye drops, vitamins, or other pills.   Progress Toward Treatment Goals:  No flowsheet data found.  Self Care Goals & Plans:  No flowsheet data found.  No flowsheet data found.   Care Management & Community Referrals:  No flowsheet data found.

## 2016-01-15 NOTE — Assessment & Plan Note (Signed)
Screening mammo ordered

## 2016-01-15 NOTE — Assessment & Plan Note (Signed)
I think her concern for a thyroid problem in relation to her weight gain is reasonable since she is a very active person and following a healthy diet. I will check a TSH today.

## 2016-01-15 NOTE — Assessment & Plan Note (Signed)
I doubt she has a UTI since her frequency has been ongoing for a month. Could be stress incontinence. Will check a UA.

## 2016-01-16 LAB — MICROSCOPIC EXAMINATION: Casts: NONE SEEN /lpf

## 2016-01-16 LAB — URINALYSIS, ROUTINE W REFLEX MICROSCOPIC
Bilirubin, UA: NEGATIVE
Glucose, UA: NEGATIVE
Ketones, UA: NEGATIVE
Nitrite, UA: NEGATIVE
PH UA: 7 (ref 5.0–7.5)
PROTEIN UA: NEGATIVE
RBC, UA: NEGATIVE
Specific Gravity, UA: 1.014 (ref 1.005–1.030)
Urobilinogen, Ur: 0.2 mg/dL (ref 0.2–1.0)

## 2016-01-16 LAB — BMP8+ANION GAP
Anion Gap: 19 mmol/L — ABNORMAL HIGH (ref 10.0–18.0)
BUN / CREAT RATIO: 16 (ref 9–23)
BUN: 14 mg/dL (ref 6–24)
CHLORIDE: 100 mmol/L (ref 96–106)
CO2: 24 mmol/L (ref 18–29)
Calcium: 9.3 mg/dL (ref 8.7–10.2)
Creatinine, Ser: 0.86 mg/dL (ref 0.57–1.00)
GFR calc Af Amer: 92 mL/min/{1.73_m2} (ref >59)
GFR calc non Af Amer: 80 mL/min/{1.73_m2} (ref >59)
GLUCOSE: 87 mg/dL (ref 65–99)
POTASSIUM: 4.1 mmol/L (ref 3.5–5.2)
SODIUM: 143 mmol/L (ref 134–144)

## 2016-01-16 LAB — VITAMIN D 25 HYDROXY (VIT D DEFICIENCY, FRACTURES): Vit D, 25-Hydroxy: 27.6 ng/mL — ABNORMAL LOW (ref 30.0–100.0)

## 2016-01-16 LAB — TSH: TSH: 1.57 u[IU]/mL (ref 0.450–4.500)

## 2016-01-16 LAB — FOLLICLE STIMULATING HORMONE: FSH: 101.1 m[IU]/mL

## 2016-01-17 ENCOUNTER — Telehealth: Payer: Self-pay | Admitting: Internal Medicine

## 2016-01-17 DIAGNOSIS — N3000 Acute cystitis without hematuria: Secondary | ICD-10-CM

## 2016-01-17 MED ORDER — SULFAMETHOXAZOLE-TRIMETHOPRIM 400-80 MG PO TABS: 2.0000 | ORAL_TABLET | Freq: Two times a day (BID) | ORAL | Status: DC

## 2016-01-17 NOTE — Telephone Encounter (Signed)
Discussed lab results with patient. Her UA was equivocal (WBCs, few bacteria, and trace leuks) and patient still having urinary frequency so will give her a 3 day course of Bactrim for uncomplicated UTI.

## 2016-01-22 NOTE — Addendum Note (Signed)
Addended by: Hulan Fray on: 01/22/2016 08:28 AM   Modules accepted: Orders

## 2016-02-01 ENCOUNTER — Ambulatory Visit: Payer: BC Managed Care – PPO

## 2016-02-27 ENCOUNTER — Ambulatory Visit
Admission: RE | Admit: 2016-02-27 | Discharge: 2016-02-27 | Disposition: A | Discharge status: Home / Self Care | Payer: BC Managed Care – PPO | Source: Ambulatory Visit | Attending: Internal Medicine | Admitting: Internal Medicine

## 2016-02-27 DIAGNOSIS — Z1231 Encounter for screening mammogram for malignant neoplasm of breast: Secondary | ICD-10-CM

## 2016-04-18 IMAGING — CT CT ANGIO CHEST
2 of 8 series · 19 of 46 positions shown · IV contrast (Omni 300)
Comparison: Chest radiograph performed earlier today at [DATE] p.m.,
and CTA of the chest performed 02/08/2009

CLINICAL DATA: Acute onset of left shoulder and arm tingling, and
right chest pressure, now resolved. Fatigue. Initial encounter.

EXAM:
CT ANGIOGRAPHY CHEST WITH CONTRAST
TECHNIQUE: Multidetector CT imaging of the chest was performed using the
standard protocol during bolus administration of intravenous
contrast. Multiplanar CT image reconstructions and MIPs were
obtained to evaluate the vascular anatomy.
CONTRAST:  80mL OMNIPAQUE IOHEXOL 350 MG/ML SOLN

[Series 5: thins · axial · 0.66mm/px · z∈[-274,-34]mm · 16 of 267 slices shown]
[im 13/267  lung]
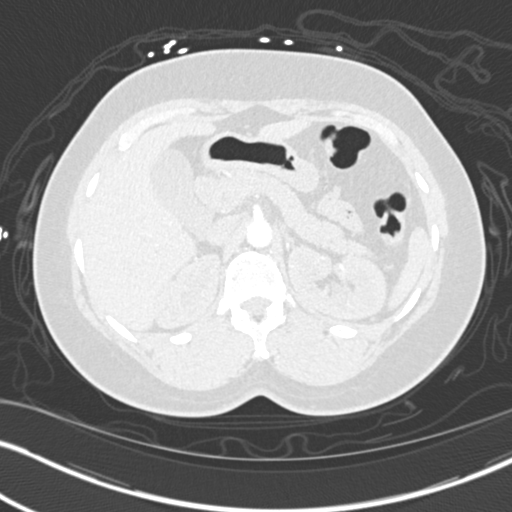
[im 25/267  soft-tissue]
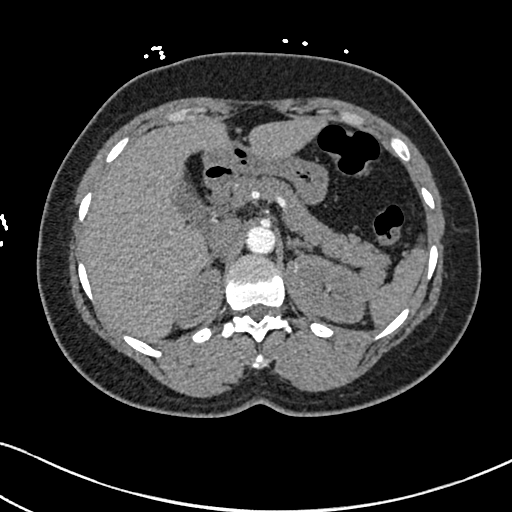
[im 49/267  lung]
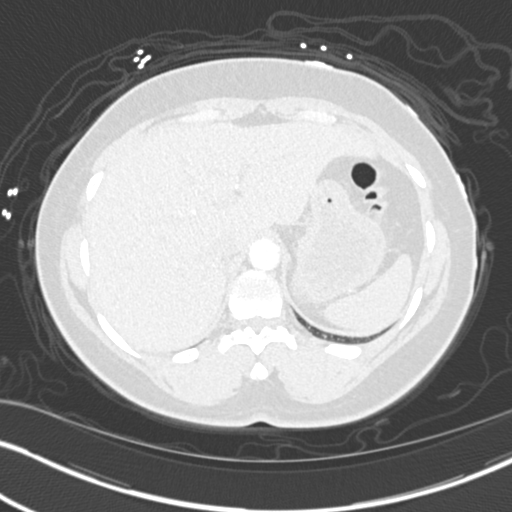
[im 61/267  soft-tissue]
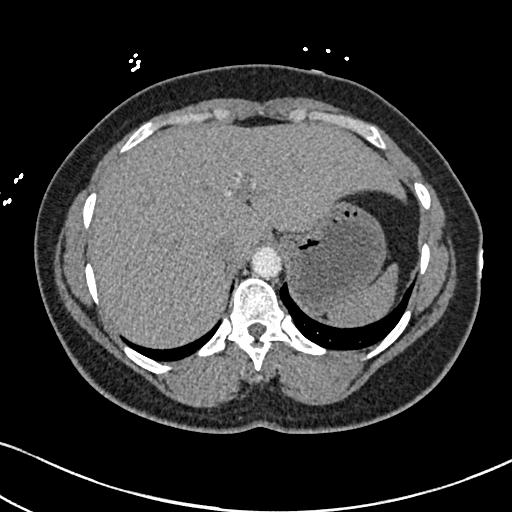
[im 73/267  lung]
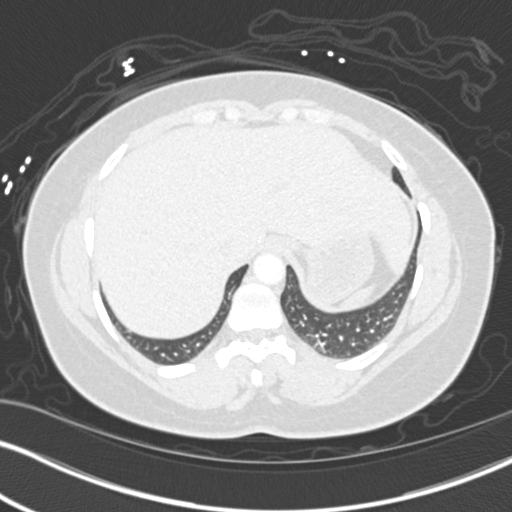
[im 97/267  soft-tissue]
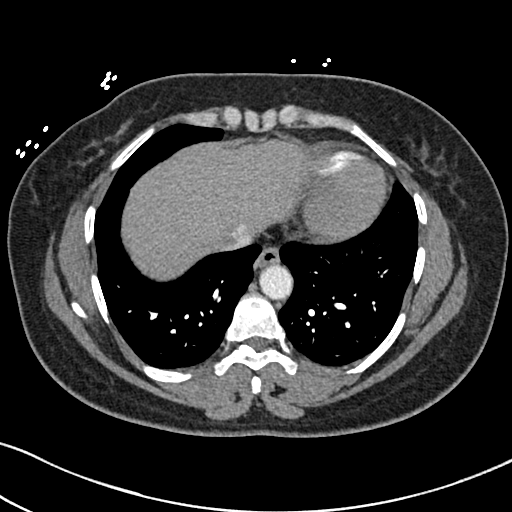
[im 109/267  lung]
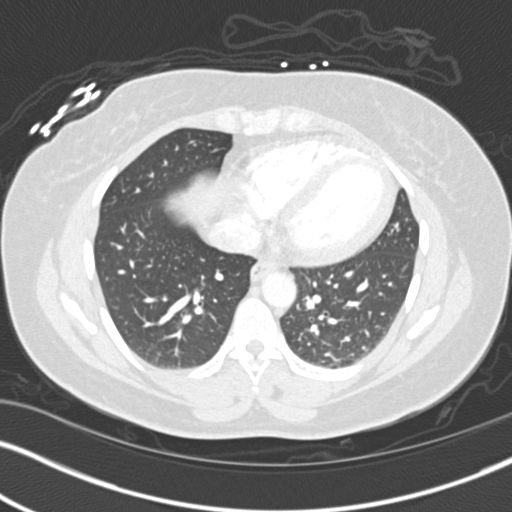
[im 121/267  soft-tissue]
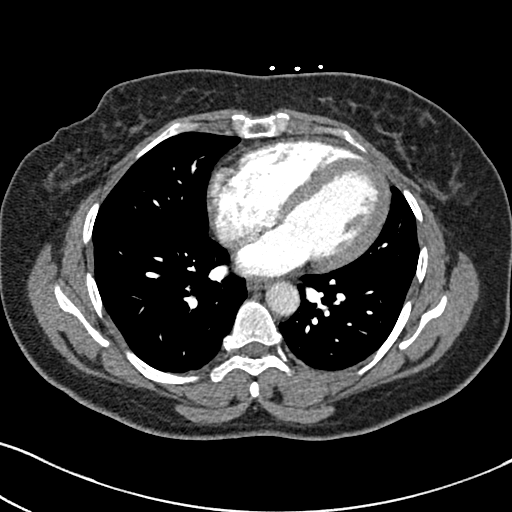
[im 146/267  lung]
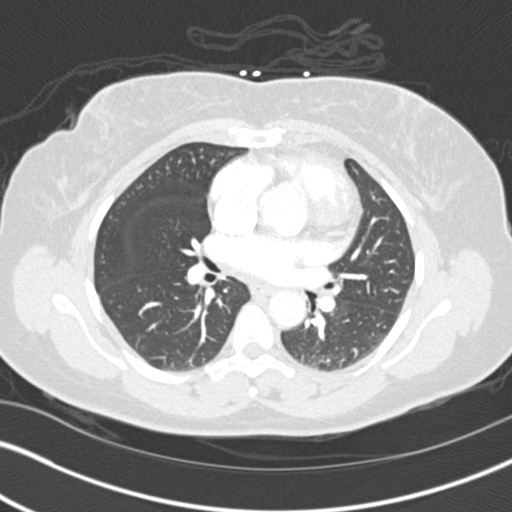
[im 158/267  soft-tissue]
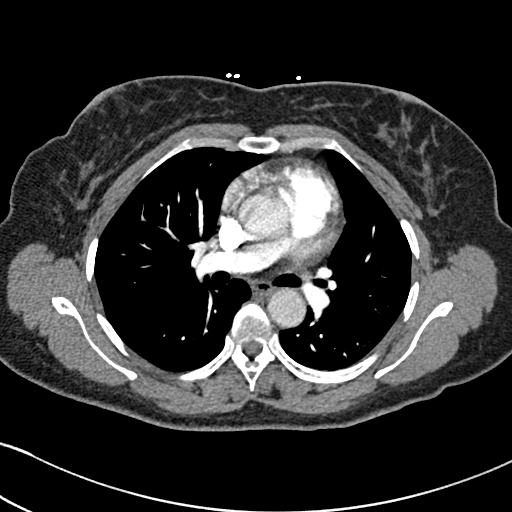
[im 170/267  lung]
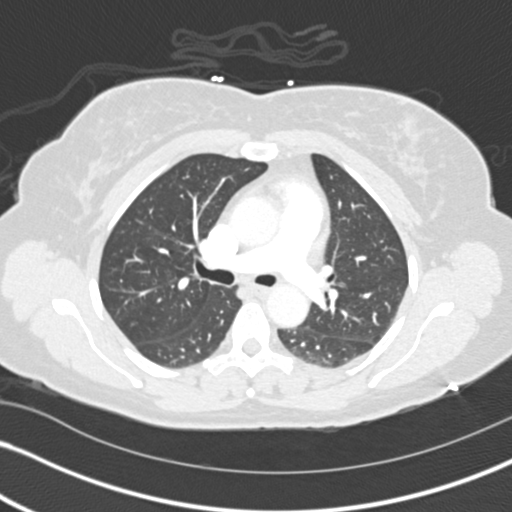
[im 194/267  soft-tissue]
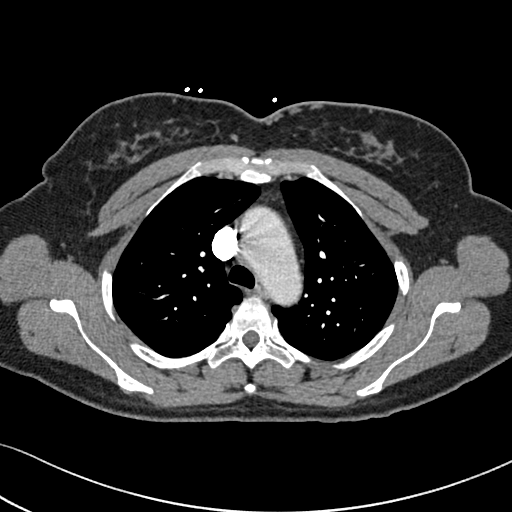
[im 206/267  lung]
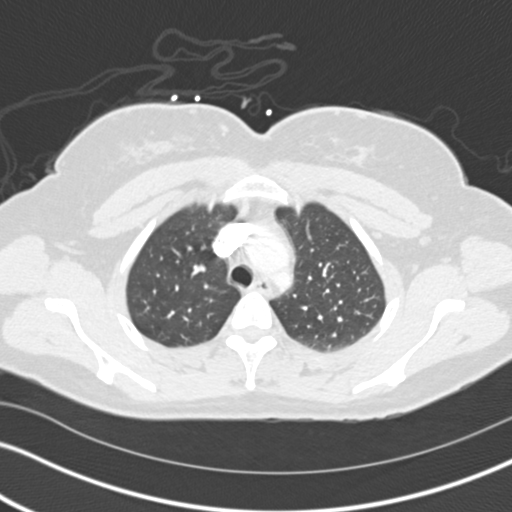
[im 218/267  soft-tissue]
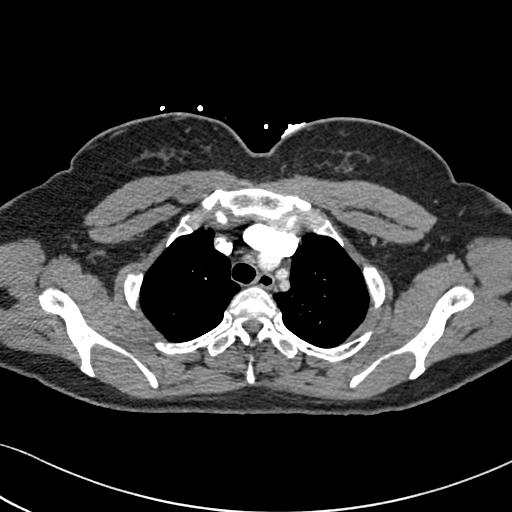
[im 242/267  lung]
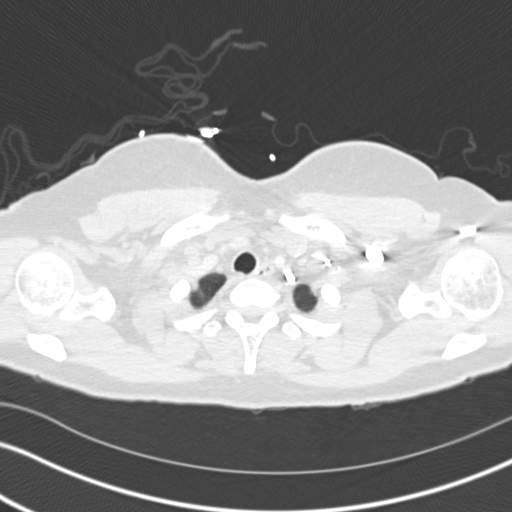
[im 254/267  soft-tissue]
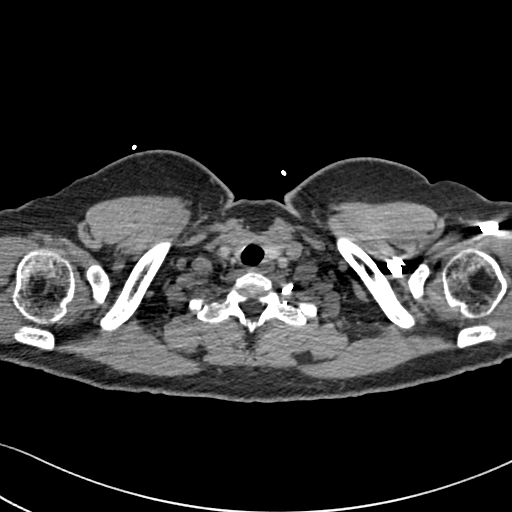

[Series 7: coronal mpr · coronal · 0.55mm/px · 3 of 124 slices shown]
[im 31/124  soft-tissue]
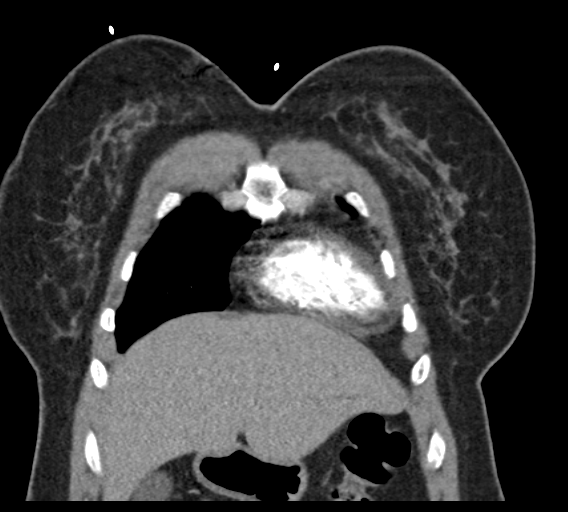
[im 62/124  soft-tissue]
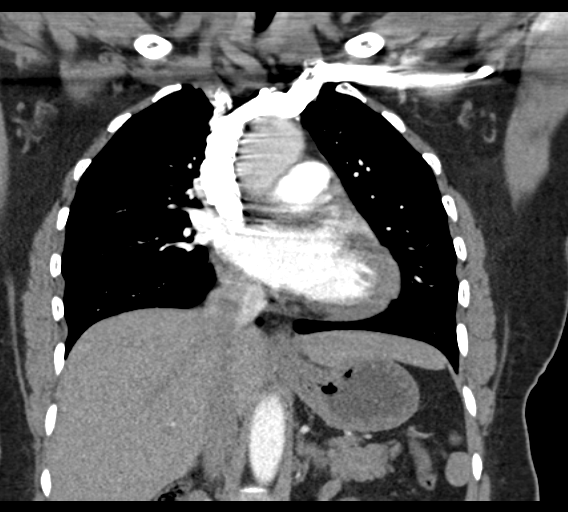
[im 93/124  soft-tissue]
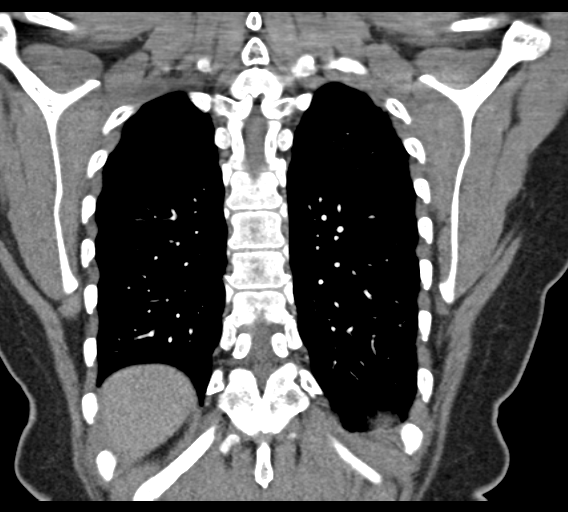

[19 of 46 positions shown; findings below may reference images not displayed]

FINDINGS: There is no evidence of pulmonary embolus.

The lungs are clear bilaterally. There is no evidence of significant
focal consolidation, pleural effusion or pneumothorax. No masses are
identified; no abnormal focal contrast enhancement is seen.

The mediastinum is unremarkable in appearance. No mediastinal
lymphadenopathy is seen. No pericardial effusion is identified. The
great vessels are grossly unremarkable in appearance. No axillary
lymphadenopathy is seen. The visualized portions of the thyroid
gland are unremarkable in appearance.

The visualized portions of the liver and spleen are unremarkable.
The visualized portions of the pancreas, gallbladder, stomach,
adrenal glands and kidneys are within normal limits.

No acute osseous abnormalities are seen.

Review of the MIP images confirms the above findings.
IMPRESSION: 1. No evidence of pulmonary embolus.
2. Lungs clear bilaterally.

## 2016-04-18 IMAGING — DX DG CHEST 2V
2 series · 2 of 2 positions shown · non-contrast
Comparison: 02/08/2009

CLINICAL DATA: Chest pain, left facial tingling

EXAM:
CHEST  2 VIEW

[chest pa]
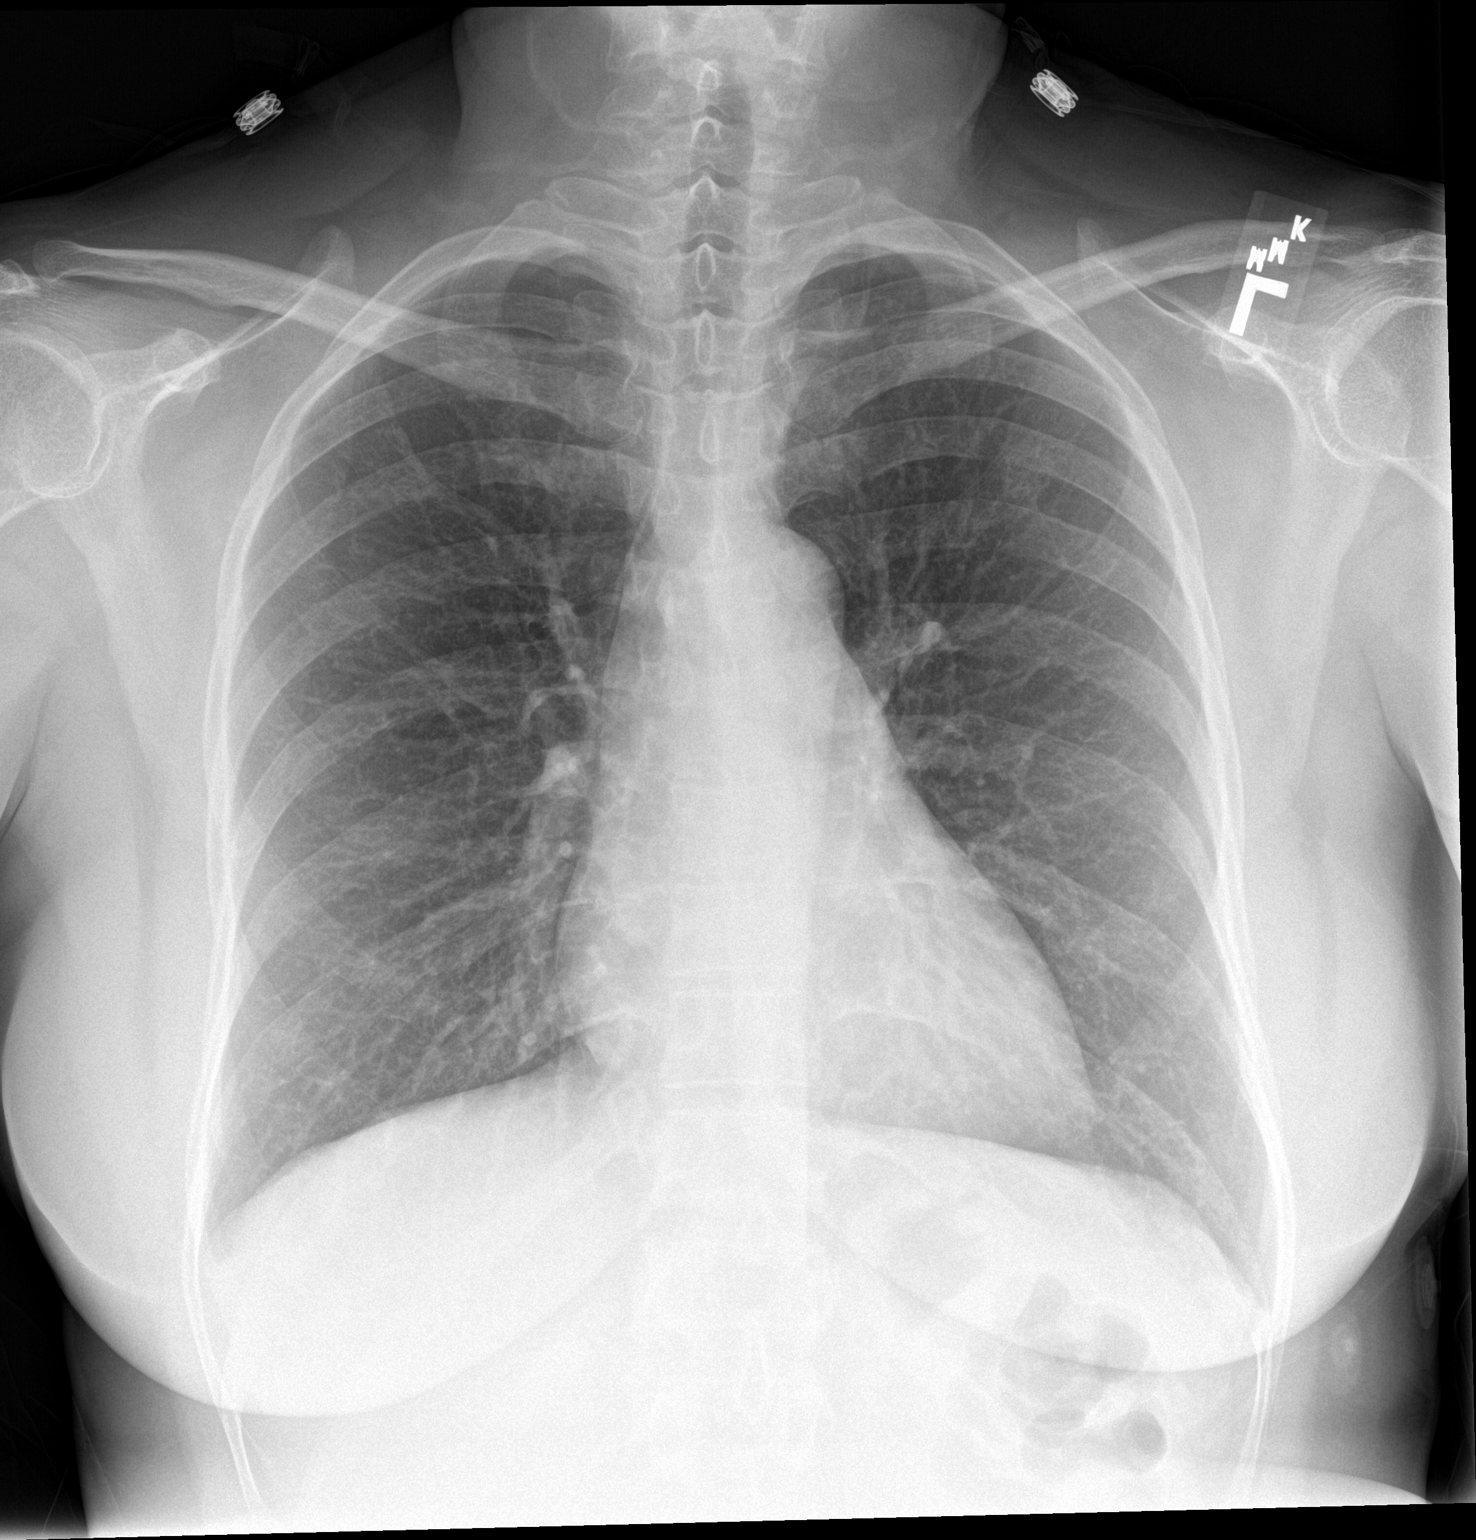

[chest lat]
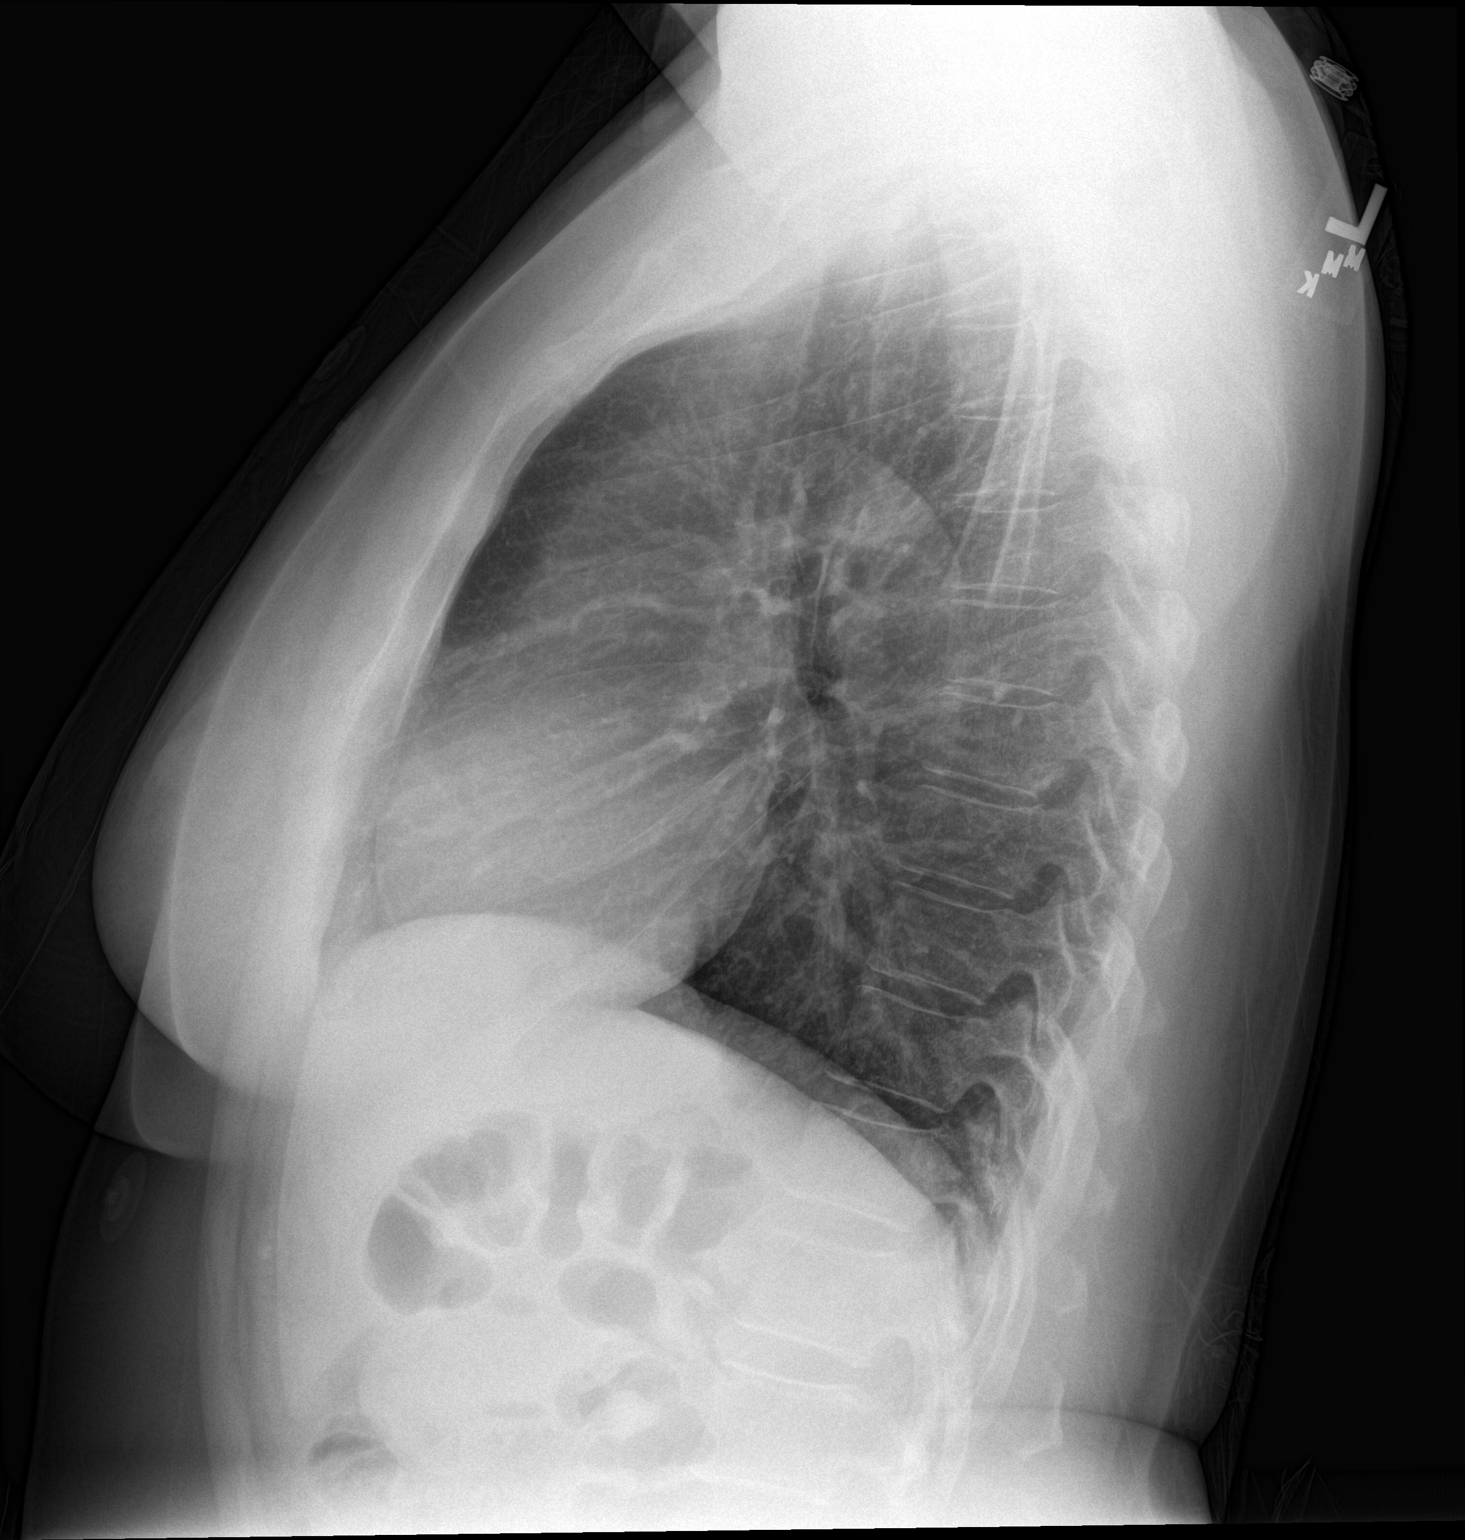

[2 of 2 positions shown; findings below may reference images not displayed]

FINDINGS: Cardiomediastinal silhouette is stable. No acute infiltrate or
pleural effusion. No pulmonary edema. Bony thorax is unremarkable.
IMPRESSION: No active cardiopulmonary disease.

## 2016-08-22 ENCOUNTER — Encounter: Payer: Self-pay | Admitting: Internal Medicine

## 2016-08-25 ENCOUNTER — Telehealth: Payer: Self-pay | Admitting: Dietician

## 2016-08-25 NOTE — Telephone Encounter (Signed)
Left voicemail with call back information at patient's request

## 2016-08-28 NOTE — Telephone Encounter (Addendum)
Patient calls asking for return message to her voicemail and to leave my email. This was done today. She answered and made an appointment for help with weight loss. Will request referral

## 2016-08-28 NOTE — Telephone Encounter (Signed)
Called [employer]- she is eligible for nutrition counseling 6 visits annually per calendar year at 100%.

## 2016-09-04 ENCOUNTER — Telehealth: Payer: Self-pay | Admitting: Internal Medicine

## 2016-09-04 NOTE — Telephone Encounter (Signed)
APT. REMINDER CALL, LMTCB °

## 2016-09-05 ENCOUNTER — Ambulatory Visit (INDEPENDENT_AMBULATORY_CARE_PROVIDER_SITE_OTHER): Payer: BC Managed Care – PPO | Admitting: Dietician

## 2016-09-05 DIAGNOSIS — Z6833 Body mass index (BMI) 33.0-33.9, adult: Secondary | ICD-10-CM | POA: Diagnosis not present

## 2016-09-05 DIAGNOSIS — E6609 Other obesity due to excess calories: Secondary | ICD-10-CM | POA: Diagnosis not present

## 2016-09-05 DIAGNOSIS — Z713 Dietary counseling and surveillance: Secondary | ICD-10-CM | POA: Diagnosis not present

## 2016-09-05 NOTE — Patient Instructions (Addendum)
Maudie Mercury- I rewrote this hoping it will be clearer.  If you have questions, please do not hesitate to call- [phone]   Butch Penny [name]  From visit on 09-05-16  Based on activity factor of lightly active- exercise/sports 1-3 x/week You need 2,130 Calories/day to maintain your weight.   You need 1,630 Calories/day to lose 1 lb per week.  The recommendation below are based on moderately active- exercise/sports 3-5 x/week You need 2,400 Calories/day to maintain your weight.   You need 1,900 Calories/day to lose 1 lb per week.  Protein needs   for weight loss 75-85 grams per day  Minimal protein needs (when not trying to cut calories) 50-60 grams per day       To summarize: Eating a healthy, well balanced meal pattern with adequate nutrition including protein and fiber and figuring out what behavior changes/ food choices can help you to shave off some calories is more important than the calculations.  That being said; I think daily you need at least 1600 calories and up to 1900 calories (maybe more?) for weight loss and at least 14% - 18% of that should come from 60-80 grams of protein.   1600-1900 calories per day; 240-320 coming from protein 60 grams protein x 4 calories/gram = 240 calories from protein  80 grams protein x 4 calories/gram = 320 calories from protein

## 2016-09-05 NOTE — Progress Notes (Signed)
  Medical Nutrition Therapy:  Appt start time: 1030 end time:  1130. Visit # 1  Assessment:  Primary concerns today: wants weight loss. Of 25 pounds 'in a hurry"  She has always been athletic, wears fit bit and gets ~ 10k steps a day or more, works out for 1 hours on treadmil or elliptical 5 days a week. Tried to count calories in the past for about 6 months and got frustrated because she did not lose weight. 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 Her 24 hour food recall indicates a deficiency in protein and possibly fiber as well. Her meal pattern is insufficient breakfast and lunch. It is likely she is missing some calories somewhere. She has been in menopause for ~ 5 years or more. She reports her bowels move normally.  Preferred Learning Style:No preference indicated  Learning Readiness: Contemplating  ANTHROPOMETRICS:see flowsheet, BMI-33.7 WEIGHT HISTORY: Highest: 203.6# Lowest-150#, documented loss from 190# to 175# from 2010 to 2011 then slow gradual regain and surpassed her baseline in 2016 and now is 10# more than in 2010.  SLEEP:6.5 to 8.5 hours a night- sleeps well Food Intolerances: fish  Dining Out (times/week): 1x/week- [employer], [employer] Tuesday's or Fridays MEDICATIONS/Supplements- women's chewable one a day x2/day, denies other herbs or medicine  DIETARY INTAKE: Usual eating pattern includes 2 meals and 0 snacks per day. Everyday foods include l;ikes pasta and bread but has cut it way back.  Avoided foods include fish and fried foods.   24-hr recall:  B ( AM): 12 oz coffee with 1 Tbsp liquids creamer and 1 tsp sugar , 1-2x/week eats yogurt or fruit with her coffee L ( PM): fruit or salad from the school cafeteria D ( PM): a meat not fired, chicken beef or pork but not hamburget Beverages: coffee x 12 oz, water 17 oz x3, 12 oz juice./day  Usual physical activity: works out 5 days a week and has been doing this for 6-12 months and has always been very  active.   Estimated daily energy needs: 1600-2000 calories for weight loss 50-60 for weight maintenance,  75-85 g protein   Progress Towards Goal(s):  In progress.   Nutritional Diagnosis:  NB-1.1 Food and nutrition-related knowledge deficit As related to lack of adequate meal planning education.  As evidenced by her report and meal pattern lacking in protein and likley fiber.    Intervention:  Nutrition education about a well balanced meal pattern,  Protein needs, protein metabolism during weight loss, recommendations for gradual weight loss and how to keep a food diary.  Coordination of care: none needed at this time, patient may want to consider weight loss medication such as Ally  Teaching Method Utilized: Visual,,Auditory,Hands on Handouts given during visit include: Barriers to learning/adherence to lifestyle change: competing values and lifestyle Demonstrated degree of understanding via:  Teach Back   Monitoring/Evaluation:  Dietary intake, exercise, food records, and body weight in 3 week(s). Margaret Pacheco, Margaret Pacheco, Margaret Pacheco 09/05/2016 3:57 PM.

## 2016-09-19 ENCOUNTER — Ambulatory Visit (INDEPENDENT_AMBULATORY_CARE_PROVIDER_SITE_OTHER): Payer: BC Managed Care – PPO | Admitting: Internal Medicine

## 2016-09-19 VITALS — BP 138/92 | HR 63 | Temp 98.2°F | Ht 65.0 in | Wt 203.3 lb

## 2016-09-19 DIAGNOSIS — B9689 Other specified bacterial agents as the cause of diseases classified elsewhere: Secondary | ICD-10-CM | POA: Diagnosis not present

## 2016-09-19 DIAGNOSIS — Z780 Asymptomatic menopausal state: Secondary | ICD-10-CM

## 2016-09-19 DIAGNOSIS — N390 Urinary tract infection, site not specified: Secondary | ICD-10-CM | POA: Diagnosis not present

## 2016-09-19 DIAGNOSIS — Z87440 Personal history of urinary (tract) infections: Secondary | ICD-10-CM

## 2016-09-19 DIAGNOSIS — N3001 Acute cystitis with hematuria: Secondary | ICD-10-CM

## 2016-09-19 MED ORDER — SULFAMETHOXAZOLE-TRIMETHOPRIM 800-160 MG PO TABS: 1.0000 | ORAL_TABLET | Freq: Two times a day (BID) | ORAL | 0 refills | Status: DC

## 2016-09-19 NOTE — Patient Instructions (Addendum)
Ms. Jarratt,  I am sedning in a prescription in for your UTI. Bactrim twice a day for 3 days.

## 2016-09-19 NOTE — Progress Notes (Signed)
   CC: Dysuria  HPI:  Margaret Pacheco is a 51 y.o. female with a past medical history listed below here today with complaints of dysuria.   Reports symptoms started a week ago. She is having urinary frequency and urgency. Has pain with urination, difficult to describe but denies any burning sensation. Has had UTIs in the past and reports this feels similar.   Started noticing blood in her urine today. Post menopausal - LMP 12-13 years ago. Currently sexually active with one long term partner. No vaginal discharge or itching.   No fevers. Reports chills. No nausea, vomiting, abdominal or flank pain. Does not right lower back pain intermittently over the past week. Has gotten worse yesterday and today. Reports good PO intake.   Past Medical History:  Diagnosis Date  . Allergy   . Asthma     Review of Systems:   Negative except as noted above  Physical Exam:  Vitals:   09/19/16 1550  BP: (!) 138/92  Pulse: 63  Temp: 98.2 F (36.8 C)  TempSrc: Oral  SpO2: 100%  Weight: 203 lb 4.8 oz (92.2 kg)  Height: 5\' 5"  (1.651 m)   Physical Exam  Constitutional: She is well-developed, well-nourished, and in no distress. No distress.  Cardiovascular: Normal rate, regular rhythm and normal heart sounds.   Pulmonary/Chest: Effort normal and breath sounds normal.  Abdominal: Soft. Bowel sounds are normal.  Musculoskeletal:  No CVA tenderness  Skin: Skin is warm and dry.     Assessment & Plan:   See Encounters Tab for problem based charting.  Patient discussed with Dr. Angelia Mould

## 2016-09-20 DIAGNOSIS — N390 Urinary tract infection, site not specified: Secondary | ICD-10-CM | POA: Insufficient documentation

## 2016-09-20 LAB — URINALYSIS, COMPLETE
BILIRUBIN UA: NEGATIVE
Glucose, UA: NEGATIVE
Ketones, UA: NEGATIVE
NITRITE UA: NEGATIVE
PH UA: 8 — AB (ref 5.0–7.5)
Specific Gravity, UA: 1.018 (ref 1.005–1.030)
Urobilinogen, Ur: 1 mg/dL (ref 0.2–1.0)

## 2016-09-20 LAB — MICROSCOPIC EXAMINATION
CASTS: NONE SEEN /LPF
WBC, UA: >30 /hpf — AB (ref 0–?)

## 2016-09-20 NOTE — Assessment & Plan Note (Signed)
Reports symptoms started a week ago. She is having urinary frequency and urgency. Has pain with urination, difficult to describe but denies any burning sensation. Has had UTIs in the past and reports this feels similar.   Started noticing blood in her urine today. Post menopausal - LMP 12-13 years ago. Currently sexually active with one long term partner. No vaginal discharge or itching.   No fevers. Reports chills. No nausea, vomiting, abdominal or flank pain. Does not right lower back pain intermittently over the past week. Has gotten worse yesterday and today. Reports good PO intake.   Plan Urinalysis with negative nitrite but numerous leukocytes and few bacteria.  Will treat with bactrim 800-160 mg bid x 3 days Given recent treatment for UTI with bactrim, will send culture to ensure no resistance

## 2016-09-21 LAB — URINE CULTURE

## 2016-09-22 ENCOUNTER — Telehealth: Payer: Self-pay | Admitting: Internal Medicine

## 2016-09-22 NOTE — Telephone Encounter (Signed)
APT. REMINDER CALL, LMTCB °

## 2016-09-23 ENCOUNTER — Ambulatory Visit: Payer: BC Managed Care – PPO | Admitting: Dietician

## 2016-09-23 NOTE — Progress Notes (Signed)
Internal Medicine Clinic Attending  Case discussed with Dr. Boswell at the time of the visit.  We reviewed the resident's history and exam and pertinent patient test results.  I agree with the assessment, diagnosis, and plan of care documented in the resident's note.  

## 2017-02-01 ENCOUNTER — Encounter (HOSPITAL_COMMUNITY): Payer: Self-pay | Admitting: Emergency Medicine

## 2017-02-01 ENCOUNTER — Ambulatory Visit (HOSPITAL_COMMUNITY)
Admission: EM | Admit: 2017-02-01 | Discharge: 2017-02-01 | Disposition: A | Discharge status: Home / Self Care | Payer: BC Managed Care – PPO | Attending: Family Medicine | Admitting: Family Medicine

## 2017-02-01 DIAGNOSIS — W57XXXA Bitten or stung by nonvenomous insect and other nonvenomous arthropods, initial encounter: Secondary | ICD-10-CM | POA: Diagnosis not present

## 2017-02-01 DIAGNOSIS — M795 Residual foreign body in soft tissue: Secondary | ICD-10-CM

## 2017-02-01 DIAGNOSIS — S20469A Insect bite (nonvenomous) of unspecified back wall of thorax, initial encounter: Secondary | ICD-10-CM

## 2017-02-01 MED ORDER — DOXYCYCLINE HYCLATE 100 MG PO CAPS: 100.0000 mg | ORAL_CAPSULE | Freq: Two times a day (BID) | ORAL | 0 refills | Status: DC

## 2017-02-01 MED ORDER — BACITRACIN ZINC 500 UNIT/GM EX OINT
TOPICAL_OINTMENT | CUTANEOUS | Status: AC
  Filled 2017-02-01: qty 0.9

## 2017-02-01 MED ORDER — BACITRACIN ZINC 500 UNIT/GM EX OINT
TOPICAL_OINTMENT | Freq: Once | CUTANEOUS | Status: AC
  Administered 2017-02-01: 1 via TOPICAL

## 2017-02-01 MED ORDER — LIDOCAINE HCL (PF) 2 % IJ SOLN
INTRAMUSCULAR | Status: AC
  Filled 2017-02-01: qty 2

## 2017-02-01 NOTE — ED Provider Notes (Addendum)
[facility]    CSN: [id number] Arrival date & time: 02/01/17  1236     History   Chief Complaint Chief Complaint  Patient presents with  . Tick Removal    HPI Margaret Pacheco is a 51 y.o. female.   The history is provided by the patient. No language interpreter was used.  Tick Bite: three days ago her sister noticed a tick on her back and tried to remove it but could not get the whole tick off. She is uncertain how long the tick had been on her back. No fever, no pain, but feels some irritation and itching around the bite area. She works in her home garden and plant flowers. No recent hiking or trip to the mountains.  Past Medical History:  Diagnosis Date  . Allergy   . Asthma     Patient Active Problem List   Diagnosis Date Noted  . UTI (urinary tract infection) 09/20/2016  . Preventative health care 01/15/2016  . Elevated blood pressure 01/15/2016  . Urinary frequency 01/15/2016  . Obesity, morbid ([place]) 12/11/2015  . Hot flashes, menopausal 12/11/2015  . Vitamin D deficiency 09/26/2010  . History of pulmonary embolus (PE) 11/09/2008    History reviewed. No pertinent surgical history.  OB History    No data available       Home Medications    Prior to Admission medications   Not on File    Family History Family History  Problem Relation Age of Onset  . Heart disease Mother   . Cancer Father     Social History Social History  Substance Use Topics  . Smoking status: Never Smoker  . Smokeless tobacco: Not on file  . Alcohol use No     Allergies   Norethin-eth estrad triphasic and Penicillins   Review of Systems Review of Systems  Eyes: Negative.   Respiratory: Negative.   Cardiovascular: Negative.   Gastrointestinal: Negative.   Musculoskeletal: Negative.   Skin:       Tick bite on her back  Psychiatric/Behavioral: The patient is not hyperactive.   All other systems reviewed and are negative.      Physical Exam Triage  Vital Signs ED Triage Vitals  Enc Vitals Group     BP 02/01/17 1338 133/79     Pulse Rate 02/01/17 1338 60     Resp 02/01/17 1338 18     Temp 02/01/17 1338 98.2 F (36.8 C)     Temp Source 02/01/17 1338 Oral     SpO2 02/01/17 1338 100 %     Weight --      Height --      Head Circumference --      Peak Flow --      Pain Score 02/01/17 1337 0     Pain Loc --      Pain Edu? --      Excl. in [place]? --    No data found.   Updated Vital Signs BP 133/79 (BP Location: Right Arm)   Pulse 60   Temp 98.2 F (36.8 C) (Oral)   Resp 18   SpO2 100%   Visual Acuity Right Eye Distance:   Left Eye Distance:   Bilateral Distance:    Right Eye Near:   Left Eye Near:    Bilateral Near:     Physical Exam  Constitutional: She appears well-developed.  Pulmonary/Chest: Effort normal.  Skin:  Small 66mm by 2 mm lesion on her back ( left  flank area), difficult to see tick head due to irritation by patient.  Nursing note and vitals reviewed.      UC Treatments / Results  Labs (all labs ordered are listed, but only abnormal results are displayed) Labs Reviewed - No data to display  EKG  EKG Interpretation None       Radiology No results found.  Procedures .Foreign Body Removal Date/Time: 02/01/2017 2:46 PM Performed by: Kinnie Feil Authorized by: Andrena Mews T  Consent: Verbal consent obtained. Risks and benefits: risks, benefits and alternatives were discussed Consent given by: patient Patient understanding: patient states understanding of the procedure being performed Procedure consent: procedure consent matches procedure scheduled Relevant documents: relevant documents present and verified Site marked: the operative site was marked Time out: Immediately prior to procedure a "time out" was called to verify the correct patient, procedure, equipment, support staff and site/side marked as required. Intake: Left flank area. Anesthesia: local  infiltration  Sedation: Patient sedated: no Patient restrained: no 1 objects recovered. Comments: Tick mouth piece removed from her left flank area after opening up her skin with a tweezers.    (including critical care time)  Medications Ordered in UC Medications - No data to display   Initial Impression / Assessment and Plan / UC Course  I have reviewed the triage vital signs and the nursing notes.  Pertinent labs & imaging results that were available during my care of the patient were reviewed by me and considered in my medical decision making (see chart for details).  Clinical Course as of Feb 02 1443  Sun Feb 01, 2017  1443 Tick bite Patient consented with removal. 2 % Lidocaine given and I was able to remove the tick mouth part stuck on her back. Patient tolerated procedure well. Wound dressed with topical antibiotic. Oral Doxy given x 1 for prophylaxis.  Return precaution discussed.   [KE]    Clinical Course User Index [KE] Kinnie Feil, MD      Final Clinical Impressions(s) / UC Diagnoses   Final diagnoses:  None    New Prescriptions New Prescriptions   No medications on file      Kinnie Feil, MD 02/01/17 1521    Kinnie Feil, MD 02/01/17 1521    Kinnie Feil, MD 02/02/17 [phone]

## 2017-02-01 NOTE — Discharge Instructions (Signed)
Please see [place] soon if having fever, pain or redness

## 2017-02-01 NOTE — ED Triage Notes (Signed)
The patient presented to the [facility] with a complaint of needing a tick removed from her back.

## 2017-02-24 ENCOUNTER — Encounter: Payer: Self-pay | Admitting: *Deleted

## 2017-06-22 ENCOUNTER — Ambulatory Visit (INDEPENDENT_AMBULATORY_CARE_PROVIDER_SITE_OTHER): Payer: BC Managed Care – PPO | Admitting: Family Medicine

## 2017-06-22 ENCOUNTER — Encounter: Payer: Self-pay | Admitting: Family Medicine

## 2017-06-22 DIAGNOSIS — G8929 Other chronic pain: Secondary | ICD-10-CM

## 2017-06-22 DIAGNOSIS — M25562 Pain in left knee: Secondary | ICD-10-CM | POA: Diagnosis not present

## 2017-06-22 MED ORDER — METHYLPREDNISOLONE ACETATE 40 MG/ML IJ SUSP
40.0000 mg | Freq: Once | INTRAMUSCULAR | Status: AC
  Administered 2017-06-22: 40 mg via INTRA_ARTICULAR

## 2017-06-22 NOTE — Patient Instructions (Signed)
You have a flap meniscus tear in addition to underlying arthritis. Your ACL is torn from your injury 8-10 years ago but with the level of arthritis I wouldn't recommend reconstructing this. We will try conservative treatment. You were given a cortisone injection today. Start physical therapy and do home exercises on days you don't go to therapy. Consider knee brace though I don't think this will increase the stability. Ok to take tylenol and/or aleve as needed for pain and inflammation. Follow up with me in 6 weeks for reevaluation.

## 2017-06-23 DIAGNOSIS — M25562 Pain in left knee: Secondary | ICD-10-CM | POA: Insufficient documentation

## 2017-06-23 NOTE — Assessment & Plan Note (Signed)
related to remote injury 8-10 years ago.  Her exam and history suggest flap type of meniscus tear.  She had an MRI about 1 1/2 years ago  Which I reviewed - she has arthritis, medial meniscus tear, and surprisingly ACL tear.  Given her degree of chondral thinning/degenerative change I recommended against ACL reconstruction.  Advised she try conservative treatment as she's never done this - physical therapy, home exercise program.  Tylenol and/or aleve if needed.  Intraarticular injection given today as well.  F/u in 6 weeks.  Consider arthroscopic debridement of flap tear if she isn't improving.  After informed written consent timeout was performed, patient was seated on exam table. Left knee was prepped with alcohol swab and utilizing anteromedial approach, patient's left knee was injected intraarticularly with 3:1 bupivicaine: depomedrol. Patient tolerated the procedure well without immediate complications.

## 2017-06-23 NOTE — Progress Notes (Signed)
PCP: Tawny Asal, MD  Subjective:   HPI: Patient is a 51 y.o. female here for left knee pain.  Patient reports about 8-10 years ago she was playing softball when she twisted her knee and had pain. Associated swelling then. She had used an immobilizer then a brace. She never completely recovered. Then about 2 weeks ago she was going up the stairs and her left knee gave out along with severe pain laterally, deep in knee. Has improved some past couple days. Pain still up to 7/10, sharp, worse with walking. Taking ibuprofen and icing. No skin changes, numbness.  Past Medical History:  Diagnosis Date  . Allergy   . Asthma     Current Outpatient Prescriptions on File Prior to Visit  Medication Sig Dispense Refill  . doxycycline (VIBRAMYCIN) 100 MG capsule Take 1 capsule (100 mg total) by mouth 2 (two) times daily. 2 capsule 0   No current facility-administered medications on file prior to visit.     No past surgical history on file.  Allergies  Allergen Reactions  . Norethin-Eth Estrad Triphasic     REACTION: pulmonary emboli  . Penicillins     Social History   Social History  . Marital status: Single    Spouse name: N/A  . Number of children: N/A  . Years of education: N/A   Occupational History  . Not on file.   Social History Main Topics  . Smoking status: Never Smoker  . Smokeless tobacco: Never Used  . Alcohol use No  . Drug use: No  . Sexual activity: Not on file   Other Topics Concern  . 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 Heart disease Mother   . Cancer Father     BP 138/84   Pulse 66   Ht 5\' 4"  (1.626 m)   Wt 193 lb (87.5 kg)   BMI 33.13 kg/m   Review of Systems: See HPI above.     Objective:  Physical Exam:  Gen: NAD, comfortable in exam room  Left knee: No gross deformity,  ecchymoses, effusion. TTP lateral > medial joint lines. FROM. Negative ant/post drawers. Negative valgus/varus testing. Negative lachmanns. Positive mcmurrays, apleys, negative patellar apprehension. NV intact distally.  Right knee: FROM without pain.   Assessment & Plan:  1. Left knee pain - related to remote injury 8-10 years ago.  Her exam and history suggest flap type of meniscus tear.  She had an MRI about 1 1/2 years ago  Which I reviewed - she has arthritis, medial meniscus tear, and surprisingly ACL tear.  Given her degree of chondral thinning/degenerative change I recommended against ACL reconstruction.  Advised she try conservative treatment as she's never done this - physical therapy, home exercise program.  Tylenol and/or aleve if needed.  Intraarticular injection given today as well.  F/u in 6 weeks.  Consider arthroscopic debridement of flap tear if she isn't improving.  After informed written consent timeout was performed, patient was seated on exam table. Left knee was prepped with alcohol swab and utilizing anteromedial approach, patient's left knee was injected intraarticularly with 3:1 bupivicaine: depomedrol. Patient tolerated the procedure well without immediate complications.

## 2017-06-29 ENCOUNTER — Encounter: Payer: Self-pay | Admitting: Family Medicine

## 2017-08-03 ENCOUNTER — Ambulatory Visit: Payer: BC Managed Care – PPO | Admitting: Family Medicine

## 2017-11-10 ENCOUNTER — Encounter: Payer: Self-pay | Admitting: Internal Medicine

## 2017-11-10 ENCOUNTER — Encounter: Payer: Self-pay | Admitting: Family Medicine

## 2017-11-24 ENCOUNTER — Other Ambulatory Visit: Payer: Self-pay

## 2017-11-24 ENCOUNTER — Ambulatory Visit: Payer: BC Managed Care – PPO | Admitting: Family Medicine

## 2017-11-24 ENCOUNTER — Encounter: Payer: Self-pay | Admitting: Family Medicine

## 2017-11-24 ENCOUNTER — Encounter: Payer: Self-pay | Admitting: Internal Medicine

## 2017-11-24 ENCOUNTER — Ambulatory Visit: Payer: BC Managed Care – PPO | Admitting: Internal Medicine

## 2017-11-24 VITALS — BP 158/92 | HR 88 | Temp 97.9°F | Ht 65.0 in | Wt 213.8 lb

## 2017-11-24 DIAGNOSIS — R350 Frequency of micturition: Secondary | ICD-10-CM

## 2017-11-24 DIAGNOSIS — Z87440 Personal history of urinary (tract) infections: Secondary | ICD-10-CM

## 2017-11-24 DIAGNOSIS — M25562 Pain in left knee: Secondary | ICD-10-CM | POA: Diagnosis not present

## 2017-11-24 DIAGNOSIS — R809 Proteinuria, unspecified: Secondary | ICD-10-CM

## 2017-11-24 DIAGNOSIS — G8929 Other chronic pain: Secondary | ICD-10-CM | POA: Diagnosis not present

## 2017-11-24 DIAGNOSIS — R030 Elevated blood-pressure reading, without diagnosis of hypertension: Secondary | ICD-10-CM

## 2017-11-24 HISTORY — DX: Proteinuria, unspecified: R80.9

## 2017-11-24 LAB — POCT URINALYSIS DIPSTICK
Bilirubin, UA: NEGATIVE
Blood, UA: NEGATIVE
Glucose, UA: NEGATIVE
Ketones, UA: NEGATIVE
Leukocytes, UA: NEGATIVE
Nitrite, UA: NEGATIVE
Spec Grav, UA: 1.02 (ref 1.010–1.025)
Urobilinogen, UA: 1 E.U./dL
pH, UA: 7.5 (ref 5.0–8.0)

## 2017-11-24 MED ORDER — METHYLPREDNISOLONE ACETATE 40 MG/ML IJ SUSP
40.0000 mg | Freq: Once | INTRAMUSCULAR | Status: AC
  Administered 2017-11-24: 40 mg via INTRA_ARTICULAR

## 2017-11-24 NOTE — Patient Instructions (Signed)
FOLLOW-UP INSTRUCTIONS When: in 3 months with dr Tarri Abernethy For: urinary frequency What to bring: medications  Try Kegel exercises to see if this helps with your urinary symtpoms

## 2017-11-24 NOTE — Progress Notes (Signed)
   CC: here for urinary frequency  HPI:  Margaret Pacheco is a 52 y.o. woman with PMHx as below here today for general follow up and complaint of urinary frequency for a month.  Reports feels similar to her prior UTI's.  Her last UTI was in January 2018.  She does deny any dysuria, fever, chills, abdominal pain.  She does not have any history of diabetes.  In office urine dipstick was negative for glucose, bilirubin, ketones, blood, nitrites, or leukocytes.  Her urine pH is 7.5 and there was 30mg /dL of proteinuria.   Past Medical History:  Diagnosis Date  . Allergy   . Asthma    Review of Systems:  Please see pertinent ROS reviewed in HPI and problem based charting.   Physical Exam:  Vitals:   11/24/17 1623  BP: (!) 158/92  Pulse: 88  Temp: 97.9 F (36.6 C)  TempSrc: Oral  SpO2: 100%  Weight: 213 lb 12.8 oz (97 kg)  Height: 5\' 5"  (1.651 m)   Physical Exam  Constitutional: She is oriented to person, place, and time and well-developed, well-nourished, and in no distress.  HENT:  Head: Normocephalic and atraumatic.  Eyes: EOM are normal.  Cardiovascular: Normal rate and regular rhythm.  No murmur heard. Pulmonary/Chest: Effort normal and breath sounds normal.  Abdominal: Soft. Bowel sounds are normal.  Neurological: She is alert and oriented to person, place, and time.  Psychiatric: Mood and affect normal.     Assessment & Plan:   See Encounters Tab for problem based charting.  Patient discussed with Dr. Eppie Gibson.  Urinary frequency Her symptoms have been ongoing for a month and was only associated with frequency so we opted to check a urine dipstick that was not indicative of infection.    Plan: - Recommended Kegel type exercises to see if this helps relieve symptoms of frequency and urgency  Elevated blood pressure reading BP is elevated today in our clinic but was normal earlier today at her sports medicine office visit.  She is not on medication and does not have a  history of HTN  Plan: - RTC 3 months with her PCP to recheck BP and consider need for treatment.   Proteinuria Her urine dipstick today is positive for protein but no blood to suggest glomerular disease.  This was present last year as well but was in the setting of a urinary tract infection.  She has no history of DM or HTN.  No peripheral edema noted on exam or by patient.   Plan: - RTC 3 months with PCP to repeat UA and assess for persistent protein.  If persistent, would need quantification and evaluation for underlying causes.

## 2017-11-24 NOTE — Patient Instructions (Addendum)
You have a flap meniscus tear in addition to underlying arthritis. Your ACL is torn from your injury 8-10 years ago but with the level of arthritis I wouldn't recommend reconstructing this. You were given a cortisone injection today - we can repeat these every 3 months if needed. Tylenol and/or aleve if needed. We also discussed the gel shots and referral to orthopedics to consider arthroscopic surgery.

## 2017-11-24 NOTE — Assessment & Plan Note (Signed)
Her symptoms have been ongoing for a month and was only associated with frequency so we opted to check a urine dipstick that was not indicative of infection.    Plan: - Recommended Kegel type exercises to see if this helps relieve symptoms of frequency and urgency

## 2017-11-24 NOTE — Assessment & Plan Note (Signed)
Her urine dipstick today is positive for protein but no blood to suggest glomerular disease.  This was present last year as well but was in the setting of a urinary tract infection.  She has no history of DM or HTN.  No peripheral edema noted on exam or by patient.   Plan: - RTC 3 months with PCP to repeat UA and assess for persistent protein.  If persistent, would need quantification and evaluation for underlying causes.

## 2017-11-24 NOTE — Assessment & Plan Note (Signed)
BP is elevated today in our clinic but was normal earlier today at her sports medicine office visit.  She is not on medication and does not have a history of HTN  Plan: - RTC 3 months with her PCP to recheck BP and consider need for treatment.

## 2017-11-25 ENCOUNTER — Encounter: Payer: Self-pay | Admitting: Family Medicine

## 2017-11-25 NOTE — Assessment & Plan Note (Signed)
related to remote injury 8-10 years ago.  Injection helped her significantly up until about mid February.  This is suggestive that the chondral thinning and degenerative changes may be accounting for more of her symptoms than the meniscus.  I recommended we go ahead with a repeat cortisone injection today which was performed.  Tylenol and/or Aleve as needed.  We also discussed Visco supplementation and referral to orthopedics to consider if arthroscopic surgery would be beneficial.  MRI had shown ACL tear and medial meniscus tear.  After informed written consent timeout was performed, patient was seated on exam table. Left knee was prepped with alcohol swab and utilizing anteromedial approach, patient's left knee was injected intraarticularly with 3:1 bupivicaine: depomedrol. Patient tolerated the procedure well without immediate complications.

## 2017-11-25 NOTE — Progress Notes (Signed)
PCP: Ina Homes, MD  Subjective:   HPI: Patient is a 52 y.o. female here for left knee pain.  10/8: Patient reports about 8-10 years ago she was playing softball when she twisted her knee and had pain. Associated swelling then. She had used an immobilizer then a brace. She never completely recovered. Then about 2 weeks ago she was going up the stairs and her left knee gave out along with severe pain laterally, deep in knee. Has improved some past couple days. Pain still up to 7/10, sharp, worse with walking. Taking ibuprofen and icing. No skin changes, numbness.  3/12: Patient reports the injection helped her a lot up until early-Mid February. Pain is 6/10 and a soreness to posterior knee. Feels like she had catching deep in knee the other day. No skin changes, numbness.  Past Medical History:  Diagnosis Date  . Allergy   . Asthma     Current Outpatient Medications on File Prior to Visit  Medication Sig Dispense Refill  . doxycycline (VIBRAMYCIN) 100 MG capsule Take 1 capsule (100 mg total) by mouth 2 (two) times daily. 2 capsule 0   No current facility-administered medications on file prior to visit.     History reviewed. No pertinent surgical history.  Allergies  Allergen Reactions  . Norethin-Eth Estrad Triphasic     REACTION: pulmonary emboli  . Penicillins     Social History   Socioeconomic History  . Marital status: Single    Spouse name: Not on file  . Number of children: Not on file  . Years of education: Not on file  . Highest education level: Not on file  Social Needs  . Financial resource strain: Not on file  . Food insecurity - worry: Not on file  . Food insecurity - inability: Not on file  . Transportation needs - medical: Not on file  . Transportation needs - non-medical: Not on file  Occupational History  . Not on file  Tobacco Use  . Smoking status: Never Smoker  . Smokeless tobacco: Never Used  Substance and Sexual Activity  .  Alcohol use: No    Alcohol/week: 0.0 oz  . Drug use: No  . Sexual activity: Not on file  Other Topics Concern  . 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 Heart disease Mother   . Cancer Father     BP 124/84   Pulse 71   Ht 5\' 5"  (1.651 m)   Wt 195 lb (88.5 kg)   BMI 32.45 kg/m   Review of Systems: See HPI above.     Objective:  Physical Exam:  Gen: NAD, comfortable in exam room.  Left knee: No gross deformity, ecchymoses, effusion. TTP lateral and medial joint lines. FROM with 5/5 strength. Negative ant/post drawers. Negative valgus/varus testing. Negative lachmanns. Negative mcmurrays, apleys, patellar apprehension. NV intact distally.  Assessment & Plan:  1. Left knee pain - related to remote injury 8-10 years ago.  Injection helped her significantly up until about mid February.  This is suggestive that the chondral thinning and degenerative changes may be accounting for more of her symptoms than the meniscus.  I recommended we go ahead with a repeat cortisone injection today which was performed.  Tylenol and/or Aleve as needed.  We also discussed Visco supplementation and referral to orthopedics to consider  if arthroscopic surgery would be beneficial.  MRI had shown ACL tear and medial meniscus tear.  After informed written consent timeout was performed, patient was seated on exam table. Left knee was prepped with alcohol swab and utilizing anteromedial approach, patient's left knee was injected intraarticularly with 3:1 bupivicaine: depomedrol. Patient tolerated the procedure well without immediate complications.

## 2017-11-26 NOTE — Progress Notes (Signed)
Patient ID: Margaret Pacheco, female   DOB: [birth year], 52 y.o.   MRN: [id number]  Case discussed with Dr. Juleen China at the time of the visit.  We reviewed the resident's history and exam and pertinent patient test results.  I agree with the assessment, diagnosis and plan of care documented in the resident's note.

## 2018-02-20 ENCOUNTER — Telehealth: Payer: Self-pay | Admitting: Internal Medicine

## 2018-02-20 MED ORDER — SULFAMETHOXAZOLE-TRIMETHOPRIM 800-160 MG PO TABS
1.0000 | ORAL_TABLET | Freq: Two times a day (BID) | ORAL | 0 refills | Status: AC
Start: 2018-02-20 — End: 2018-02-25

## 2018-02-20 NOTE — Telephone Encounter (Signed)
  INTERNAL MEDICINE RESIDENCY PROGRAM After-Hours Telephone Call    Reason for call:   I received a call from [name] at 10:43 AM, 02/20/2018 indicating she has been having dysuria and increased frequency for 4 days.    Pertinent Data:   Pt feels she has a UTI as she's had dysuria and increased frequency for the past 4 days. She's been using OTC Azo which has helped the pain, but notes continued dysuria when she doesn't take this.   Denied fevers, chills, abdominal pain, back pain, nausea, vomiting or hematuria. Reports most recent UTI >6 months ago.   Reports she's unable to go to ED today as it's a family member's graduation and she'll be travelling.     Assessment / Plan / Recommendations:   Patient with possible UTI. Doubt systemic infection due to lack of fever, malaise, back pain, nausea or vomiting. Will treat empirically with Bactrim ds BID x5-days although I've asked her to present to [facility], urgent care or the ED should her symptoms change or fail to clear after completing her course of antibiotics.   As always, pt is advised that if symptoms worsen or new symptoms arise, they should go to an urgent care facility or to to ER for further evaluation.    Matther Labell, DO   02/20/2018, 10:43 AM

## 2018-03-04 ENCOUNTER — Encounter: Payer: Self-pay | Admitting: Family Medicine

## 2018-09-16 ENCOUNTER — Ambulatory Visit: Payer: BC Managed Care – PPO | Admitting: Internal Medicine

## 2018-09-16 ENCOUNTER — Encounter: Payer: Self-pay | Admitting: Internal Medicine

## 2018-09-16 DIAGNOSIS — Z6835 Body mass index (BMI) 35.0-35.9, adult: Secondary | ICD-10-CM

## 2018-09-16 LAB — POCT GLYCOSYLATED HEMOGLOBIN (HGB A1C): Hemoglobin A1C: 5.6 % (ref 4.0–5.6)

## 2018-09-16 LAB — GLUCOSE, CAPILLARY: Glucose-Capillary: 81 mg/dL (ref 70–99)

## 2018-09-16 NOTE — Patient Instructions (Signed)
Thank you for allowing [place] to provide your care. Today you are going to check some labs. If they come back normal I will send out a medication that will help you with weight loss. Continue to focus on diet and exercise disease as this will have a lasting impact even after we stop the medications. Please come back to see me in four weeks.

## 2018-09-17 LAB — TSH: TSH: 0.874 u[IU]/mL (ref 0.450–4.500)

## 2018-09-17 MED ORDER — TOPIRAMATE ER 50 MG PO CAP24: 50.0000 mg | ORAL_CAPSULE | Freq: Every day | ORAL | 0 refills | Status: DC

## 2018-09-17 MED ORDER — PHENTERMINE HCL 15 MG PO CAPS: 15.0000 mg | ORAL_CAPSULE | ORAL | 0 refills | Status: DC

## 2018-09-17 NOTE — Assessment & Plan Note (Signed)
HPI: patient presents for continued evaluation and management continuing weight gain. Over the past year weight is increased from 195 pounds to 211 pounds. She states that she has met with nutritionist and follows a fairly strict diet. She tries to avoid high carb foods. She states that she exercises daily and often does a mixture of aerobic and resistance training. She has not been able to lose weight and continues to gain weight. She does note increased constipation but otherwise denies signs or symptoms of hypothyroidism. She is not noticed any specific pattern of fat accumulation. She does not feel like she is retaining fluid.  On physical exam there is no super clavicular fat, posterior neck fat, abdominal distention, fluid wave, thinning of the skin with purple strata on the abdomen. There is no lower extremity edema.  Assessment/plan: discuss with the patient possible options including medications. She states that she would be open to medications. Encouraged her to continue to make her lifestyle modifications as these will be will help her in the long run. We discussed starting phentermine and topiramate. We discussed that these are not long-term medications and that she will need frequent follow-up. We discussed possible side effects including jittery notes, insomnia, palpitations, increased sweating, decreased appetite within phentermine. We also discussed mental slowing and topiramate. She voices understanding.  TSH and hemoglobin A1c were obtained to rule out endocrine causes. Both were within normal. I have called patient to update her and have sent out prescriptions for phentermine and topiramate. She will follow-up in four weeks to see how she is doing on this medication and at that point I would recommend an abdominal ultrasound just to ensure we're not missing an accumulation of fluid.

## 2018-09-17 NOTE — Progress Notes (Signed)
   CC: Weight gain  HPI:  [name] is a 53 y.o. female with PMHx listed below presenting for weight gain. Please see the A&P for the status of the patient's chronic medical problems.  Past Medical History:  Diagnosis Date  . Allergy   . Asthma    Review of Systems: Performed and all others negative.  Physical Exam: Vitals:   09/16/18 1536  BP: (!) 144/90  Pulse: 77  Temp: 98.8 F (37.1 C)  TempSrc: Oral  SpO2: 100%  Weight: 211 lb 14.4 oz (96.1 kg)   General: Obese female in no acute distress Pulm: Good air movement with no wheezing or crackles  CV: RRR, no murmurs, no rubs  Abdomen: Active bowel sounds, soft, non-distended, no tenderness to palpation   Assessment & Plan:   See Encounters Tab for problem based charting.  Patient discussed with Dr. Dareen Piano

## 2018-09-20 NOTE — Progress Notes (Signed)
Internal Medicine Clinic Attending  Case discussed with Dr. Helberg at the time of the visit.  We reviewed the resident's history and exam and pertinent patient test results.  I agree with the assessment, diagnosis, and plan of care documented in the resident's note.    

## 2018-09-23 ENCOUNTER — Other Ambulatory Visit: Payer: Self-pay | Admitting: Internal Medicine

## 2018-09-23 MED ORDER — TOPIRAMATE 50 MG PO TABS: 50.0000 mg | ORAL_TABLET | Freq: Every day | ORAL | 0 refills | Status: DC

## 2018-09-23 NOTE — Progress Notes (Signed)
Generic topiramate sent in.

## 2018-09-28 ENCOUNTER — Encounter: Payer: Self-pay | Admitting: Internal Medicine

## 2018-10-20 ENCOUNTER — Other Ambulatory Visit: Payer: Self-pay

## 2018-10-20 ENCOUNTER — Ambulatory Visit: Payer: BC Managed Care – PPO | Admitting: Internal Medicine

## 2018-10-20 ENCOUNTER — Encounter: Payer: Self-pay | Admitting: Internal Medicine

## 2018-10-20 DIAGNOSIS — Z79899 Other long term (current) drug therapy: Secondary | ICD-10-CM | POA: Diagnosis not present

## 2018-10-20 DIAGNOSIS — Z6833 Body mass index (BMI) 33.0-33.9, adult: Secondary | ICD-10-CM

## 2018-10-20 MED ORDER — TOPIRAMATE 50 MG PO TABS: 50.0000 mg | ORAL_TABLET | Freq: Every day | ORAL | 0 refills | Status: DC

## 2018-10-20 MED ORDER — PHENTERMINE HCL 15 MG PO CAPS: 15.0000 mg | ORAL_CAPSULE | ORAL | 0 refills | Status: DC

## 2018-10-20 NOTE — Patient Instructions (Signed)
Ms. Holstad,   Continue taking phentermine and Topamax as usual.  Please call us if you start to experience any of the side effects we discussed today.  Continue to eat a healthy diet and workout.   I ordered an abdominal ultrasound for evaluation of weight gain per your doctor's recommendation.  The radiology department will call you to schedule this study.  Please make a follow-up appointment with your regular doctor, Dr. Tarri Abernethy, in 4 weeks to follow-up on weight loss.  In the meantime, please call us if you have any questions or concerns.  -Dr. Frederico Hamman

## 2018-10-21 ENCOUNTER — Encounter: Payer: Self-pay | Admitting: Internal Medicine

## 2018-10-21 NOTE — Progress Notes (Signed)
   CC: Obesity follow up  HPI:  [name] is a 53 y.o. year-old female with PMH listed below who presents to clinic for follow up for obesity. Please see problem based assessment and plan for further details.   Past Medical History:  Diagnosis Date  . Allergy   . Asthma    Review of Systems:   Review of Systems  Constitutional: Positive for weight loss.  Cardiovascular: Negative for chest pain and palpitations.  Gastrointestinal: Negative for abdominal pain, constipation, diarrhea, nausea and vomiting.  Neurological: Negative for dizziness, tremors and headaches.  Psychiatric/Behavioral: The patient does not have insomnia.     Physical Exam: Vitals:   10/20/18 1528  BP: (!) 142/92  Pulse: 63  Temp: 98.1 F (36.7 C)  TempSrc: Oral  SpO2: 100%  Weight: 203 lb (92.1 kg)  Height: 5\' 5"  (1.651 m)    General:well-appearing female, appears younger than stated age, in no acute distress  Cardiac: regular rate and rhythm, nl S1/S2, no murmurs, rubs or gallops  Pulm: CTAB, no wheezes or crackles, no increased work of breathing on room air  Abd: soft, NTND, bowel sounds present Ext: warm and well perfused, no peripheral edema    Assessment & Plan:   See Encounters Tab for problem based charting.  Patient discussed with Dr. Lynnae January

## 2018-10-21 NOTE — Assessment & Plan Note (Signed)
Margaret Pacheco presents for follow up of obesity. She was seen by her PCP 1 month ago and started on phentermine and Topamax for weight loss. She has been taking these medication every day as prescribed. She was initially feeling jittery with the phentermine, but this resolved after a few days. She has otherwise been doing well and has lost 8 lbs. Denies changes in appetite. 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 We discussed the importance of continuing lifestyle modifications as these medications will not be long term. I refilled phentermine and Topamax for 30 days and recommended her to follow up with her PCP in 4 weeks. I also ordered an abdominal US per PCP recommendation to evaluate for intra-abdominal fluid.

## 2018-10-22 NOTE — Progress Notes (Signed)
Internal Medicine Clinic Attending  Case discussed with Dr. Santos at the time of the visit.  We reviewed the resident's history and exam and pertinent patient test results.  I agree with the assessment, diagnosis, and plan of care documented in the resident's note.    

## 2018-11-15 ENCOUNTER — Other Ambulatory Visit: Payer: Self-pay | Admitting: *Deleted

## 2018-11-15 MED ORDER — PHENTERMINE HCL 15 MG PO CAPS: 15.0000 mg | ORAL_CAPSULE | ORAL | 0 refills | Status: DC

## 2018-11-15 MED ORDER — TOPIRAMATE 50 MG PO TABS: 50.0000 mg | ORAL_TABLET | Freq: Every day | ORAL | 0 refills | Status: DC

## 2018-11-15 NOTE — Telephone Encounter (Signed)
Needs appointment with me or [facility] in next couple weeks. She will need to discuss stopping these medications within the next 1-2 months. Thank you.

## 2018-11-15 NOTE — Telephone Encounter (Signed)
Per patient-she is unable to make any afternoon appts at this time. Wants to know if and when she should return for labs and/or visit.  Would [facility] be appropriate?  Please advise.Regenia Skeeter, Masao Junker Cassady3/2/20209:21 AM

## 2018-11-17 ENCOUNTER — Other Ambulatory Visit: Payer: Self-pay | Admitting: Internal Medicine

## 2018-11-19 ENCOUNTER — Telehealth: Payer: Self-pay | Admitting: Internal Medicine

## 2018-11-19 ENCOUNTER — Encounter: Payer: Self-pay | Admitting: Internal Medicine

## 2018-11-19 NOTE — Telephone Encounter (Signed)
There was no prior authorization for the medicine so the insurance isn't paying for the medicine, pls contact [phone]

## 2018-12-09 ENCOUNTER — Encounter: Payer: Self-pay | Admitting: Internal Medicine

## 2018-12-16 ENCOUNTER — Ambulatory Visit (INDEPENDENT_AMBULATORY_CARE_PROVIDER_SITE_OTHER): Payer: BC Managed Care – PPO | Admitting: Internal Medicine

## 2018-12-16 ENCOUNTER — Encounter: Payer: Self-pay | Admitting: Internal Medicine

## 2018-12-16 ENCOUNTER — Other Ambulatory Visit: Payer: Self-pay

## 2018-12-16 NOTE — Progress Notes (Addendum)
   CC: Morbid Obesity  This is a telephone encounter between [employer] and [employer] on 12/16/2018 for morbid obesity. The visit was conducted with the patient located at work and [employer] at [facility]. The patient's identity was confirmed using their DOB and current address. The patient has consented to being evaluated through a telephone encounter and understands the associated risks (an examination cannot be done and the patient may need to come in for an appointment) / benefits (allows the patient to remain at home, decreasing exposure to coronavirus). I personally spent 6 minutes on medical discussion.   HPI:  [name] is a 53 y.o. female with PMHx listed below presenting for morbid obesity. Please see the A&P for the status of the patient's chronic medical problems.  Past Medical History:  Diagnosis Date  . Allergy   . Asthma    Review of Systems:  Performed and all others negative.  Assessment & Plan:   See Encounters Tab for problem based charting.  Patient discussed with Dr. Beryle Beams

## 2018-12-16 NOTE — Progress Notes (Signed)
Medicine attending: Medical history, presenting problems,  and medications, reviewed with resident physician Dr Justin Helberg on the day of the patient telephone consultation and I concur with his evaluation and management plan. 

## 2018-12-16 NOTE — Assessment & Plan Note (Signed)
HPI:  Patient initially evaluated in January this year. She noted increasing weight over the past several months and was requesting medication to assist her in weight loss. At that time we started phentermine and topiramate. We spent a significant amount of time discussing lifestyle changes in order to maintain the weight loss and that these medications were not long-term medications. Today I called the patient via telephone to follow-up. Since starting these medication she has lost 25 pounds. She has increased her aerobic activity and now walks daily. She states that with the gym now being close this had made it more difficult to exercise however with the improving weather she is able to get outside. As far as her diet, she is no longer eating fried food and primarily eating baked foods. She is no longer drinking sugary beverages and primarily drinking water.   A/P: Patient has had approximately 25 pound weight loss since starting these medications. She states that overall she feels much better. She is been on these medications for three months and we will therefore discontinue her phentermine and topiramate. She will continue lifestyle modifications to work on further weight loss.

## 2019-02-15 ENCOUNTER — Ambulatory Visit (HOSPITAL_COMMUNITY): Payer: BC Managed Care – PPO

## 2019-02-18 ENCOUNTER — Encounter: Payer: Self-pay | Admitting: Internal Medicine

## 2019-03-23 ENCOUNTER — Other Ambulatory Visit: Payer: Self-pay

## 2019-03-23 ENCOUNTER — Ambulatory Visit (HOSPITAL_COMMUNITY)
Admission: RE | Admit: 2019-03-23 | Discharge: 2019-03-23 | Disposition: A | Discharge status: Home / Self Care | Payer: BC Managed Care – PPO | Source: Ambulatory Visit | Attending: Internal Medicine | Admitting: Internal Medicine

## 2019-03-23 ENCOUNTER — Other Ambulatory Visit: Payer: Self-pay | Admitting: Internal Medicine

## 2019-03-24 ENCOUNTER — Other Ambulatory Visit: Payer: Self-pay

## 2019-03-24 ENCOUNTER — Encounter: Payer: Self-pay | Admitting: Internal Medicine

## 2019-03-24 ENCOUNTER — Ambulatory Visit: Payer: BC Managed Care – PPO | Admitting: Internal Medicine

## 2019-03-24 DIAGNOSIS — Z6832 Body mass index (BMI) 32.0-32.9, adult: Secondary | ICD-10-CM

## 2019-03-24 DIAGNOSIS — I10 Essential (primary) hypertension: Secondary | ICD-10-CM

## 2019-03-24 DIAGNOSIS — R809 Proteinuria, unspecified: Secondary | ICD-10-CM

## 2019-03-24 MED ORDER — TOPIRAMATE 50 MG PO TABS: 50.0000 mg | ORAL_TABLET | Freq: Every day | ORAL | 2 refills | Status: DC

## 2019-03-24 MED ORDER — PHENTERMINE HCL 15 MG PO CAPS: 15.0000 mg | ORAL_CAPSULE | ORAL | 2 refills | Status: DC

## 2019-03-24 NOTE — Telephone Encounter (Signed)
Pt states   phentermine 15 MG capsule   Needs PA.

## 2019-03-24 NOTE — Assessment & Plan Note (Addendum)
HPI:  Patient was initially seen in Jan 2020. She was working on weight loss and despite lifestyle modifications continuing to gain weight. She was started on phentermine and topiramate which resulted in a 25lbs weight loss (211lbs -> 185lbs). In 12/2018 the medications were stopped. Since that time she has continued to exercise daily and avoid excess liquid calories and high fat/carb meals. Her weight today is 192lbs. She is requesting to go back on phentermine and topiramate.   A/P: - Consoling on lifestyle modifications and that this is the foundation to maintaining weight loss. We discussed that it is not uncommon to have a slight "rebound" with discontinuation of weight loss medications.  - Discussed alternative medications for weight loss and the risks associated with phentermine and topiramate. We will use these medication for an additional 3 months then reassess.  - She is post menopausal.

## 2019-03-24 NOTE — Assessment & Plan Note (Signed)
Proteinuria on last UA. Recheck today.

## 2019-03-24 NOTE — Assessment & Plan Note (Signed)
HPI:  Multiple prior visits with BP >130/80 diagnostic for HTN. With the 20lbs weight loss her BP is significantly improved. She is not on any medications.   A/P: - Continue to monitor  - Check BMP and UA today

## 2019-03-24 NOTE — Patient Instructions (Signed)
Thank you for allowing me to provide your care. Today we are going to check some lab work and if everything looks okay we will restart the combination medication for additional weight loss.   Continue to exercise daily and avoid high fat/high carb foods.   Please come back to see me in 3 months or sooner if any issues arise.

## 2019-03-24 NOTE — Progress Notes (Signed)
   CC: Weight loss  HPI:  Margaret Pacheco is a 53 y.o. female with PMHx listed below presenting for weight loss. Please see the A&P for the status of the patient's chronic medical problems.  Past Medical History:  Diagnosis Date  . Allergy   . Asthma    Review of Systems:  Performed and all others negative.  Physical Exam: Vitals:   03/24/19 1323  BP: 117/77  Pulse: 72  Temp: 98.6 F (37 C)  TempSrc: Oral  SpO2: 99%  Weight: 192 lb 8 oz (87.3 kg)  Height: 5\' 5"  (1.651 m)   General: Obese female in no acute distress Pulm: Good air movement with no wheezing or crackles  CV: RRR, no murmurs, no rubs   Assessment & Plan:   See Encounters Tab for problem based charting.  Patient discussed with Dr. Evette Doffing

## 2019-03-25 LAB — URINALYSIS, ROUTINE W REFLEX MICROSCOPIC
Bilirubin, UA: NEGATIVE
Glucose, UA: NEGATIVE
Leukocytes,UA: NEGATIVE
Nitrite, UA: NEGATIVE
RBC, UA: NEGATIVE
Specific Gravity, UA: 1.027 (ref 1.005–1.030)
Urobilinogen, Ur: 0.2 mg/dL (ref 0.2–1.0)
pH, UA: 5.5 (ref 5.0–7.5)

## 2019-03-25 LAB — BMP8+ANION GAP
Anion Gap: 19 mmol/L — ABNORMAL HIGH (ref 10.0–18.0)
BUN/Creatinine Ratio: 14 (ref 9–23)
BUN: 13 mg/dL (ref 6–24)
CO2: 23 mmol/L (ref 20–29)
Calcium: 9 mg/dL (ref 8.7–10.2)
Chloride: 102 mmol/L (ref 96–106)
Creatinine, Ser: 0.92 mg/dL (ref 0.57–1.00)
GFR calc Af Amer: 83 mL/min/{1.73_m2} (ref >59)
GFR calc non Af Amer: 72 mL/min/{1.73_m2} (ref >59)
Glucose: 83 mg/dL (ref 65–99)
Potassium: 3.9 mmol/L (ref 3.5–5.2)
Sodium: 144 mmol/L (ref 134–144)

## 2019-03-25 LAB — MICROSCOPIC EXAMINATION
Casts: NONE SEEN /lpf
Epithelial Cells (non renal): >10 /hpf — AB (ref 0–10)

## 2019-03-25 NOTE — Telephone Encounter (Signed)
PA submitted online via Cover My Meds-request sent for "review".Regenia Skeeter, Lilybelle Mayeda Cassady7/10/202010:28 AM

## 2019-03-25 NOTE — Progress Notes (Signed)
Internal Medicine Clinic Attending  Case discussed with Dr. Helberg at the time of the visit.  We reviewed the resident's history and exam and pertinent patient test results.  I agree with the assessment, diagnosis, and plan of care documented in the resident's note.    

## 2019-03-25 NOTE — Telephone Encounter (Signed)
rtc to pt, she had questions about PA, refills, answered the questions. Pt is pleased will ask gladys to call her when PA goes thru

## 2019-03-25 NOTE — Telephone Encounter (Signed)
Pt is calling back, pls return call

## 2019-03-28 NOTE — Telephone Encounter (Signed)
Notice of Determination Date: 03/25/2019 [facility] [place], [place] [address] Plan Member Name: Margaret Pacheco Plan Member ID: [id number] Plan Name: [place] [phone] Non-Grandfathered Prescriber Name: [facility] Prescriber Phone: [phone] Prescriber Fax: [phone] Dear [name]: [facility]  received a request from your provider for coverage of Phentermine. The request was denied because: You do not meet the requirements of your plan. Your Phentermine/Phendimetrazine/Didrex/Diethylpropion criteria covers this drug when you have not received 3 months of therapy with the requested drug within the past year. Your request has been denied based on the information we have. You can ask for a free copy of the actual benefit provision, guideline, protocol or other similar criterion used to make the decision and any other information related to this decision by calling Customer Care at [phone]. For more information regarding your prescription benefit, please refer to your Benefit Booklet available on the Plan's website at [email or web address] You may request an appeal by sending a written request to the address below. To be eligible for an appeal, your request must be in writing and received within 180 days of the date of this letter. Please mail or fax your appeal to: [facility] [address] [place], [place] [address] Fax: [phone] If your situation is urgent as defined by law, you may ask for an expedited appeal.

## 2019-03-28 NOTE — Telephone Encounter (Signed)
Received notice that medication has been denied. Call made made to patient to make her aware.  She has a "Good Rx" card and will look into paying out of pocket-no further action needed. Phone call complete.Marland KitchenMarland KitchenDespina Hidden Cassady7/13/202011:48 AM

## 2019-03-31 ENCOUNTER — Other Ambulatory Visit: Payer: Self-pay | Admitting: Internal Medicine

## 2019-03-31 DIAGNOSIS — Z1231 Encounter for screening mammogram for malignant neoplasm of breast: Secondary | ICD-10-CM

## 2019-05-16 ENCOUNTER — Encounter: Payer: Self-pay | Admitting: Internal Medicine

## 2019-05-16 ENCOUNTER — Other Ambulatory Visit: Payer: Self-pay

## 2019-05-16 ENCOUNTER — Ambulatory Visit
Admission: RE | Admit: 2019-05-16 | Discharge: 2019-05-16 | Disposition: A | Discharge status: Home / Self Care | Payer: BC Managed Care – PPO | Source: Ambulatory Visit

## 2019-05-16 DIAGNOSIS — Z1231 Encounter for screening mammogram for malignant neoplasm of breast: Secondary | ICD-10-CM

## 2019-05-16 HISTORY — DX: Essential (primary) hypertension: I10

## 2019-05-26 ENCOUNTER — Encounter: Payer: Self-pay | Admitting: Internal Medicine

## 2019-05-30 ENCOUNTER — Encounter: Payer: Self-pay | Admitting: *Deleted

## 2019-05-30 NOTE — Telephone Encounter (Signed)
Was this ever addressed?

## 2019-06-01 NOTE — Telephone Encounter (Signed)
Talked to pt - stated she prefers to see her doctor,Dr Helberg, to discuss nasal drip and weight loss med. But he does not have an available appt until Oct 15. Informed she can see someone else about nasal drip and talked w/Dr Tarri Abernethy  about wt loss med at a later appt; she's agreeable. [facility] appt scheduled Monday 9/21 @ 0845 AM (she cannot come this week).

## 2019-06-02 ENCOUNTER — Telehealth: Payer: Self-pay | Admitting: Internal Medicine

## 2019-06-02 NOTE — Telephone Encounter (Signed)
Called patient to discuss her upper airway cough syndrome. She states that it has been going on for 5-6 months. Notes that it is worse at night and early in the morning. Initially she tried claritin and this helped but now it does not seem to be helping. She wonders if it is allergy driven. She denies GERD or laryngopharyngeal symptoms. She does not have a history of Asthma. She is not a smoker. She has lost weight but this is intentional. She is on appetite suppressing medications.   She will try benadryl QHS with Flonase QD. She is scheduled for an [facility] appointment on Monday.

## 2019-06-06 ENCOUNTER — Ambulatory Visit: Payer: BC Managed Care – PPO | Admitting: Internal Medicine

## 2019-06-06 ENCOUNTER — Other Ambulatory Visit: Payer: Self-pay

## 2019-06-06 VITALS — BP 119/81 | HR 73 | Temp 98.1°F | Ht 65.0 in | Wt 184.1 lb

## 2019-06-06 DIAGNOSIS — R0982 Postnasal drip: Secondary | ICD-10-CM | POA: Diagnosis not present

## 2019-06-06 MED ORDER — FLUTICASONE PROPIONATE 50 MCG/ACT NA SUSP: 2.0000 | Freq: Two times a day (BID) | NASAL | 1 refills | Status: DC

## 2019-06-06 NOTE — Assessment & Plan Note (Signed)
Pt seen today for her ongoing post-nasal drip. States she "feels it" in the back of her throat. This has been ongoing for months. Denies fevers, cough, GERD, rhinorrhea, pain or trouble swallowing, or sore throat. Has tried Allegra, Claritin, Zyrtec, and Sudafed over-the-counter without relief. She states it is worse during a hot shower, but she can't identify any allergic triggers. No recent pets or changes in housing. Of note, she was started on Benadryl and Flonase four days ago. She has been taking these once a day and has not seen any improvement in her symptoms. Her exam is significant for mild posterior oropharyngeal and nasal sputum erythema.   Plan  - continue Benadryl at bedtime - increase Flonase to BID - if symptoms do not improve, can try adding PPI and referring to ENT

## 2019-06-06 NOTE — Patient Instructions (Addendum)
Margaret Pacheco,  Please continue taking Benadryl at night and Flonase twice a day. For the Flonase, you can take two puffs in each nostrils in the morning and again at night. If your symptoms do not improve in 2-4 weeks, then let us know and we can try other medications or a referral to an ear-nose-throat doctor.   Fluticasone nasal spray What is this medicine? FLUTICASONE (floo TIK a sone) is a corticosteroid. This medicine is used to treat the symptoms of allergies like sneezing, itchy red eyes, and itchy, runny, or stuffy nose. This medicine is also used to treat nasal polyps. This medicine may be used for other purposes; ask your health care provider or pharmacist if you have questions. COMMON BRAND NAME(S): ClariSpray, Flonase, Flonase Allergy Relief, Flonase Sensimist, Veramyst, XHANCE What should I tell my health care provider before I take this medicine? They need to know if you have any of these conditions:  eye disease, vision problems  infection, like tuberculosis, herpes, or fungal infection  recent surgery on nose or sinuses  taking a corticosteroid by mouth  an unusual or allergic reaction to fluticasone, steroids, other medicines, foods, dyes, or preservatives  pregnant or trying to get pregnant  breast-feeding How should I use this medicine? This medicine is for use in the nose. Follow the directions on your product or prescription label. This medicine works best if used at regular intervals. Do not use more often than directed. Make sure that you are using your nasal spray correctly. After 6 months of daily use for allergies, talk to your doctor or health care professional before using it for a longer time. Ask your doctor or health care professional if you have any questions. Talk to your pediatrician regarding the use of this medicine in children. Special care may be needed. Some products have been used for allergies in children as young as 2 years. After 2 months of daily  use without a prescription in a child, talk to your pediatrician before using it for a longer time. Use of this medicine for nasal polyps is not approved in children. Overdosage: If you think you have taken too much of this medicine contact a poison control center or emergency room at once. NOTE: This medicine is only for you. Do not share this medicine with others. What if I miss a dose? If you miss a dose, use it as soon as you remember. If it is almost time for your next dose, use only that dose and continue with your regular schedule. Do not use double or extra doses. What may interact with this medicine?  certain antibiotics like clarithromycin and telithromycin  certain medicines for fungal infections like ketoconazole, itraconazole, and voriconazole  conivaptan  nefazodone  some medicines for HIV  vaccines This list may not describe all possible interactions. Give your health care provider a list of all the medicines, herbs, non-prescription drugs, or dietary supplements you use. Also tell them if you smoke, drink alcohol, or use illegal drugs. Some items may interact with your medicine. What should I watch for while using this medicine? Visit your healthcare professional for regular checks on your progress. Tell your healthcare professional if your symptoms do not start to get better or if they get worse. This medicine may increase your risk of getting an infection. Tell your doctor or health care professional if you are around anyone with measles or chickenpox, or if you develop sores or blisters that do not heal properly. What side effects  may I notice from receiving this medicine? Side effects that you should report to your doctor or health care professional as soon as possible:  allergic reactions like skin rash, itching or hives, swelling of the face, lips, or tongue  changes in vision  crusting or sores in the nose  nosebleed  signs and symptoms of infection like fever  or chills; cough; sore throat  white patches or sores in the mouth or nose Side effects that usually do not require medical attention (report to your doctor or health care professional if they continue or are bothersome):  burning or irritation inside the nose or throat  changes in taste or smell  cough  headache This list may not describe all possible side effects. Call your doctor for medical advice about side effects. You may report side effects to FDA at 1-800-FDA-1088. Where should I keep my medicine? Keep out of the reach of children. Store at room temperature between 15 and 30 degrees C (59 and 86 degrees F). Avoid exposure to extreme heat, cold, or light. Throw away any unused medicine after the expiration date. NOTE: This sheet is a summary. It may not cover all possible information. If you have questions about this medicine, talk to your doctor, pharmacist, or health care provider.  2020 Elsevier/Gold Standard (2017-09-24 14:10:08)

## 2019-06-06 NOTE — Progress Notes (Signed)
   CC: post-nasal drip  HPI:  [name] is a 53 y.o. F with significant PMH as outlined below. Please see problem-based charting for additional information.  Past Medical History:  Diagnosis Date  . Allergy   . Asthma   . Essential hypertension    Review of Systems:  Review of Systems  Constitutional: Negative for chills and fever.  HENT: Positive for congestion. Negative for ear pain, sinus pain and sore throat.   Respiratory: Negative for cough and shortness of breath.   Gastrointestinal: Negative for heartburn, nausea and vomiting.   Physical Exam:  Vitals:   06/06/19 0836  BP: 119/81  Pulse: 73  Temp: 98.1 F (36.7 C)  TempSrc: Oral  SpO2: 100%  Weight: 184 lb 1.6 oz (83.5 kg)  Height: 5\' 5"  (1.651 m)   Physical Exam Vitals signs and nursing note reviewed.  HENT:     Right Ear: Tympanic membrane normal.     Left Ear: Tympanic membrane normal.     Nose: Nose normal. No rhinorrhea.     Comments: Mild erythema on nasal septum    Mouth/Throat:     Mouth: Mucous membranes are moist.     Pharynx: Posterior oropharyngeal erythema present. No oropharyngeal exudate.  Cardiovascular:     Rate and Rhythm: Normal rate and regular rhythm.     Heart sounds: Normal heart sounds.  Pulmonary:     Effort: Pulmonary effort is normal.     Breath sounds: Normal breath sounds.  Abdominal:     General: Bowel sounds are normal.     Palpations: Abdomen is soft.    Assessment & Plan:   See Encounters Tab for problem based charting.  Patient seen with Dr. Philipp Ovens

## 2019-06-08 NOTE — Progress Notes (Signed)
Internal Medicine Clinic Attending  I saw and evaluated the patient.  I personally confirmed the key portions of the history and exam documented by Dr. Jones and I reviewed pertinent patient test results.  The assessment, diagnosis, and plan were formulated together and I agree with the documentation in the resident's note.     

## 2019-06-29 ENCOUNTER — Other Ambulatory Visit: Payer: Self-pay

## 2019-06-29 ENCOUNTER — Ambulatory Visit (INDEPENDENT_AMBULATORY_CARE_PROVIDER_SITE_OTHER): Payer: BC Managed Care – PPO | Admitting: Internal Medicine

## 2019-06-29 DIAGNOSIS — Z79899 Other long term (current) drug therapy: Secondary | ICD-10-CM

## 2019-06-29 DIAGNOSIS — Z6830 Body mass index (BMI) 30.0-30.9, adult: Secondary | ICD-10-CM | POA: Diagnosis not present

## 2019-06-29 MED ORDER — LIRAGLUTIDE 18 MG/3ML ~~LOC~~ SOPN: PEN_INJECTOR | SUBCUTANEOUS | 1 refills | Status: DC

## 2019-06-29 NOTE — Progress Notes (Signed)
   CC: Obseity  HPI:  [name] is a 53 y.o. female with PMHx listed below presenting for Obseity. Please see the A&P for the status of the patient's chronic medical problems.  Past Medical History:  Diagnosis Date  . Allergy   . Asthma   . Essential hypertension    Review of Systems:  Performed and all others negative.  Physical Exam: Vitals:   06/29/19 0841  BP: 140/85  Pulse: 70  Temp: 98.3 F (36.8 C)  TempSrc: Oral  SpO2: 100%  Weight: 186 lb 1.6 oz (84.4 kg)   General: Obese female in no acute distress Pulm: Good air movement with no wheezing or crackles  CV: RRR, no murmurs, no rubs   Assessment & Plan:   See Encounters Tab for problem based charting.  Patient discussed with Dr. Evette Pacheco

## 2019-06-29 NOTE — Assessment & Plan Note (Signed)
HPI:  Patient presents for continued evaluation and management of her weight loss. She is now completed two cycles of phentermine-topiramate and experienced approximately 25 pounds weight loss. She continues to exercise daily. She states that she walks and uses the elliptical for approximately one hour daily. She does not do any resistance training. She is cut out sugary beverages and snacking. She eats 2 to 3 well-balanced meals per day. Her weight today is 186.  A/P: - Counseling on lifestyle modifications. Encouraged the patient to keep up the good work. - Will start a GLP-1 agonist today, Victoza at 0.6 mg for seven days then increase to 1.2 mg daily. She has no personal/family history of thyroid or pancreatic cancer. - She will follow-up with me in three months.

## 2019-06-29 NOTE — Patient Instructions (Signed)
Thank you for allowing me to provide your care. Keep up the great work withy our weight loss. You are doing excellent. Today we are starting a new injection medication to assist you in weight loss. We will start at 0.6 mg daily then increase to 1.2 mg daily. You may experience some nausea, vomiting, or upset stomach. If you experience any of these thing please call me.   Please come back to see me in 3 months or sooner.

## 2019-06-30 NOTE — Progress Notes (Signed)
Internal Medicine Clinic Attending  Case discussed with Dr. Helberg at the time of the visit.  We reviewed the resident's history and exam and pertinent patient test results.  I agree with the assessment, diagnosis, and plan of care documented in the resident's note.    

## 2019-07-01 ENCOUNTER — Other Ambulatory Visit: Payer: Self-pay | Admitting: *Deleted

## 2019-07-01 NOTE — Telephone Encounter (Signed)
Call from pt-requesting rx for pen needles- will send request to pcp.Despina Hidden Cassady10/16/202010:45 AM

## 2019-07-03 ENCOUNTER — Encounter: Payer: Self-pay | Admitting: Internal Medicine

## 2019-07-04 MED ORDER — PEN NEEDLES 31G X 5 MM MISC: 1.0000 [IU] | Freq: Every day | 1 refills | Status: DC

## 2019-07-23 ENCOUNTER — Other Ambulatory Visit: Payer: Self-pay

## 2019-07-23 DIAGNOSIS — Z20822 Contact with and (suspected) exposure to covid-19: Secondary | ICD-10-CM

## 2019-07-24 LAB — NOVEL CORONAVIRUS, NAA: SARS-CoV-2, NAA: NOT DETECTED

## 2019-08-22 ENCOUNTER — Other Ambulatory Visit: Payer: Self-pay | Admitting: *Deleted

## 2019-08-22 MED ORDER — LIRAGLUTIDE 18 MG/3ML ~~LOC~~ SOPN: PEN_INJECTOR | SUBCUTANEOUS | 1 refills | Status: DC

## 2019-08-23 ENCOUNTER — Ambulatory Visit (INDEPENDENT_AMBULATORY_CARE_PROVIDER_SITE_OTHER): Payer: BC Managed Care – PPO | Admitting: Internal Medicine

## 2019-08-23 ENCOUNTER — Other Ambulatory Visit: Payer: Self-pay

## 2019-08-23 ENCOUNTER — Encounter: Payer: Self-pay | Admitting: Internal Medicine

## 2019-08-23 DIAGNOSIS — Z79899 Other long term (current) drug therapy: Secondary | ICD-10-CM

## 2019-08-23 NOTE — Progress Notes (Signed)
Internal Medicine Clinic Attending  Case discussed with Dr. Helberg at the time of the visit.  We reviewed the resident's history and exam and pertinent patient test results.  I agree with the assessment, diagnosis, and plan of care documented in the resident's note.    

## 2019-08-23 NOTE — Progress Notes (Signed)
   CC: Weight loss  This is a telephone encounter between [employer] and [employer] on 08/23/2019 for Weight loss. The visit was conducted with the patient located at home and [facility]. The patient's identity was confirmed using their DOB and current address. The patient has consented to being evaluated through a telephone encounter and understands the associated risks (an examination cannot be done and the patient may need to come in for an appointment) / benefits (allows the patient to remain at home, decreasing exposure to coronavirus). I personally spent 6 minutes on medical discussion.   HPI:  [name] is a 53 y.o. with PMH as below.   Please see A&P for assessment of the patient's acute and chronic medical conditions.   Past Medical History:  Diagnosis Date  . Allergy   . Asthma   . Essential hypertension    Review of Systems:  Performed and all others negative.  Assessment & Plan:   See Encounters Tab for problem based charting.  Patient discussed with Dr. Philipp Ovens

## 2019-08-23 NOTE — Assessment & Plan Note (Addendum)
HPI:  Patient called in to discuss her weight loss. She had previously completed 2 cycles of phentermine-topiramate and experienced approximately 25 pounds weight loss. At her last visit on 10/14 she was started on Victoza 0.6 mg per seven days and then increase to 1.2 mg daily. She is tolerated this well without any nausea/vomiting, early satiety, or injection site reactions. Her weight is down to 175 pounds from her last recorded reading the clinic 186. She continues to exercise daily. She continues to avoid sugary beverages and snacking.  A/P: - Increase Victoza to 1.8 mg QD for the next 3-4 weeks. Max dose is 3.0 mg QD.

## 2019-08-24 ENCOUNTER — Encounter: Payer: Self-pay | Admitting: Internal Medicine

## 2019-09-29 ENCOUNTER — Encounter: Payer: Self-pay | Admitting: Internal Medicine

## 2019-09-30 ENCOUNTER — Telehealth: Payer: Self-pay | Admitting: *Deleted

## 2019-09-30 MED ORDER — LIRAGLUTIDE 18 MG/3ML ~~LOC~~ SOPN: PEN_INJECTOR | SUBCUTANEOUS | 3 refills | Status: DC

## 2019-10-04 MED ORDER — LIRAGLUTIDE 18 MG/3ML ~~LOC~~ SOPN: PEN_INJECTOR | SUBCUTANEOUS | 3 refills | Status: DC

## 2019-10-04 NOTE — Telephone Encounter (Signed)
Refill of Victoza at 1.8 mg QD sent to [facility].

## 2019-10-04 NOTE — Telephone Encounter (Signed)
Refill for 1.8 mg QD sent. Thank you.

## 2020-01-19 ENCOUNTER — Encounter: Payer: Self-pay | Admitting: *Deleted

## 2020-08-01 ENCOUNTER — Encounter: Payer: Self-pay | Admitting: *Deleted

## 2020-09-12 ENCOUNTER — Telehealth: Payer: BC Managed Care – PPO | Admitting: Internal Medicine

## 2020-09-17 ENCOUNTER — Other Ambulatory Visit: Payer: Self-pay

## 2020-09-17 ENCOUNTER — Telehealth (INDEPENDENT_AMBULATORY_CARE_PROVIDER_SITE_OTHER): Payer: BC Managed Care – PPO | Admitting: Internal Medicine

## 2020-09-17 MED ORDER — PEN NEEDLES 31G X 5 MM MISC: 1.0000 [IU] | Freq: Every day | 1 refills | Status: DC

## 2020-09-17 MED ORDER — LIRAGLUTIDE 18 MG/3ML ~~LOC~~ SOPN: PEN_INJECTOR | SUBCUTANEOUS | 3 refills | Status: DC

## 2020-09-19 ENCOUNTER — Encounter: Payer: Self-pay | Admitting: Internal Medicine

## 2020-09-19 NOTE — Assessment & Plan Note (Signed)
Video visit with patient today to discuss weight loss management. After completing 2 cycles of phentermine-topiramate, previous physician transitioned her to Victoza. Last titration was approximately a year ago up to the 1.8 mg dosing.  She states she has plateaued with her weight loss and has lost 5-6 lbs in the last year. She continues to maintain physical activity despite the gym challenges with the pandemic. She has also cut out fast food completely and sticks to baked white meats.   A/P -increase victoza to 2.4 mg QD. Can increase up to max dose of 3 mg at follow-up visit if indicated.

## 2020-09-19 NOTE — Progress Notes (Signed)
  [facility] Internal Medicine Residency Telephone Encounter Continuity Care Appointment  HPI:   This telephone encounter was created for [name] on 09/19/2020 for the following purpose/cc weight loss management .   Past Medical History:  Past Medical History:  Diagnosis Date  . Allergy   . Asthma   . Essential hypertension       ROS:   Negative for nausea/vomiting, change in bowel movements    Assessment / Plan / Recommendations:   Please see A&P under problem oriented charting for assessment of the patient's acute and chronic medical conditions.   As always, pt is advised that if symptoms worsen or new symptoms arise, they should go to an urgent care facility or to to ER for further evaluation.   Consent and Medical Decision Making:   Patient discussed with Dr. Mayford Knife  This is a telephone encounter between [name] and Bridget Hartshorn on 09/19/2020 for weight loss management. The visit was conducted with the patient located at home and [facility] at [facility]. The patient's identity was confirmed using their DOB and current address. The patient has consented to being evaluated through a telephone encounter and understands the associated risks (an examination cannot be done and the patient may need to come in for an appointment) / benefits (allows the patient to remain at home, decreasing exposure to coronavirus). I personally spent 15 minutes on medical discussion.

## 2020-09-20 NOTE — Progress Notes (Signed)
Internal Medicine Clinic Attending  Case discussed with Dr. Chesley Mires  At the time of the visit.    I agree with the assessment, diagnosis, and plan of care documented in the resident's note.

## 2020-09-22 ENCOUNTER — Encounter: Payer: Self-pay | Admitting: Internal Medicine

## 2020-09-24 ENCOUNTER — Telehealth: Payer: Self-pay

## 2020-09-24 NOTE — Telephone Encounter (Signed)
Please refer to message below.  Requesting call back from nurse.

## 2020-09-24 NOTE — Telephone Encounter (Signed)
Detert, Sharyon Cable, Carley D, DO 2 days ago      The pharmacy will not fill the prescription you sent over because he states that the dosage is incorrect or the medication name needs to be changed. They sent a fax three days ago and have not gotten a response.     TC to [facility], spoke with pharmacy tech who states they need to verify if the 2.4mg  daily dosage is correct.  Per Dr. Janne Napoleon last office note from 09/17/20, Victoza was increased to 2.4mg  daily.  Verification given. SChaplin, RN,BSN

## 2020-09-25 NOTE — Telephone Encounter (Signed)
Yes, Stacee spoke with [facility] yesterday and verified the dose was 2.4mg  daily.

## 2020-09-26 NOTE — Telephone Encounter (Signed)
Incoming fax from pharmacy (dated 01/08) with the following message  "Did you mean Saxenda? Victoza does not dial to 2.4mg "    Attempted to contact pt as CMA wanted to confirm that she has already picked up mediatation (as this may be an old request) no answer, message left on recorder.Despina Hidden Cassady1/12/202210:18 AM   Will contact pharmacy

## 2020-11-05 ENCOUNTER — Encounter: Payer: Self-pay | Admitting: Internal Medicine

## 2020-11-12 ENCOUNTER — Other Ambulatory Visit: Payer: Self-pay | Admitting: Student

## 2020-11-12 MED ORDER — LIRAGLUTIDE 18 MG/3ML ~~LOC~~ SOPN: 3.0000 mg | PEN_INJECTOR | Freq: Every day | SUBCUTANEOUS | 3 refills | Status: DC

## 2020-11-12 NOTE — Progress Notes (Signed)
Discussed with patient her victoza, which is prescribed for weight loss. She feels that she has plateaued at the current dose and would like to increase. Discussed also continued diet and exercise changes. She is agreeable. She will make an appointment in the future for further changes.

## 2020-12-03 ENCOUNTER — Other Ambulatory Visit: Payer: Self-pay | Admitting: Internal Medicine

## 2020-12-03 ENCOUNTER — Telehealth: Payer: Self-pay

## 2020-12-03 MED ORDER — LIRAGLUTIDE 18 MG/3ML ~~LOC~~ SOPN
3.0000 mg | PEN_INJECTOR | Freq: Every day | SUBCUTANEOUS | 3 refills | Status: DC
Start: 2020-12-03 — End: 2020-12-12

## 2020-12-03 NOTE — Telephone Encounter (Signed)
Margaret Pacheco--I just want to make sure I have the correct understanding that victoza can only be used up to 1.8mg  for diabetes?

## 2020-12-03 NOTE — Telephone Encounter (Signed)
Returned call to patient. States her Victoza is only lasting 22 days now as her dosage was increased from 2.4 mg to 3 mg. Insurance will not pay for new Rx till 12/08/2020 and she will run out before then. Call placed to [facility] at [place]. States the last Rx they received was 09/17/2020 for 2.4 mg. They do not have Rx written 11/12/2020 that has 2 sets of directions in Sig. Please review Rx and patient message from 11/05/2020. Resend Rx as appropriate. Thank you.

## 2020-12-03 NOTE — Telephone Encounter (Signed)
Pls contact pt [phone]

## 2020-12-12 ENCOUNTER — Other Ambulatory Visit: Payer: Self-pay | Admitting: Internal Medicine

## 2020-12-12 MED ORDER — SAXENDA 18 MG/3ML ~~LOC~~ SOPN: 3.0000 mg | PEN_INJECTOR | Freq: Every day | SUBCUTANEOUS | 0 refills | Status: DC

## 2020-12-12 NOTE — Telephone Encounter (Signed)
If I understand, that is correct-victoza can only be used up to 1.8mg  for diabetes.  Please change rx to saxenda.Despina Hidden Cassady3/30/20221:12 PM

## 2020-12-13 ENCOUNTER — Encounter: Payer: Self-pay | Admitting: *Deleted

## 2021-01-18 ENCOUNTER — Telehealth: Payer: Self-pay

## 2021-01-18 NOTE — Telephone Encounter (Signed)
TC to patient regarding message sent via my chart regarding RX. Pt states she went to get her Saxenda filled and was told by the pharmacy they were unable to fill this because they needed PA.  Per chart review, PA was already done and approved on 12/13/20.  Pt informed RN would call pharmacy, but if pharmacy states they still can't fill med, RN will need to send to PA nurses who will be back in the office on Monday.  Patient very appreciative.  TC to [facility], RN placed on hold X 73min with message that there are 2 callers ahead of her.  RN unable to keep holding. Forwarding to PA department. SChaplin, RN,BSN   [place], [place], RN 1 hour ago (11:24 AM)      Can I have a nurse call me regarding a prescription please?

## 2021-01-24 NOTE — Telephone Encounter (Signed)
Pt picked up rx on 05/11.  Copay $25.  No further action needed, phone call complete.Despina Hidden Cassady5/12/20229:13 AM

## 2021-02-19 ENCOUNTER — Other Ambulatory Visit: Payer: Self-pay

## 2021-02-19 MED ORDER — SAXENDA 18 MG/3ML ~~LOC~~ SOPN: 3.0000 mg | PEN_INJECTOR | Freq: Every day | SUBCUTANEOUS | 0 refills | Status: DC

## 2021-03-19 ENCOUNTER — Ambulatory Visit: Payer: BC Managed Care – PPO | Admitting: Internal Medicine

## 2021-03-19 ENCOUNTER — Encounter: Payer: Self-pay | Admitting: *Deleted

## 2021-03-19 ENCOUNTER — Other Ambulatory Visit (HOSPITAL_COMMUNITY)
Admission: RE | Admit: 2021-03-19 | Discharge: 2021-03-19 | Disposition: A | Discharge status: Home / Self Care | Payer: BC Managed Care – PPO | Source: Ambulatory Visit | Attending: Internal Medicine | Admitting: Internal Medicine

## 2021-03-19 DIAGNOSIS — I10 Essential (primary) hypertension: Secondary | ICD-10-CM | POA: Diagnosis not present

## 2021-03-19 DIAGNOSIS — Z124 Encounter for screening for malignant neoplasm of cervix: Secondary | ICD-10-CM

## 2021-03-19 DIAGNOSIS — Z1211 Encounter for screening for malignant neoplasm of colon: Secondary | ICD-10-CM

## 2021-03-19 MED ORDER — PHENTERMINE HCL 15 MG PO CAPS: 15.0000 mg | ORAL_CAPSULE | ORAL | 1 refills | Status: DC

## 2021-03-19 MED ORDER — TOPIRAMATE 50 MG PO TABS: 50.0000 mg | ORAL_TABLET | Freq: Every day | ORAL | 2 refills | Status: DC

## 2021-03-19 NOTE — Patient Instructions (Signed)
It was nice seeing you today! Thank you for choosing [facility] for your Primary Care.    Today we talked about:   Annual physical with Pap Smear: I will call you with the results Weight Loss: Lets stop Saxenda and restart Phentermine. Follow up in 1 month for blood pressure check.

## 2021-03-20 LAB — CYTOLOGY - PAP
Adequacy: ABSENT
Comment: NEGATIVE
Diagnosis: NEGATIVE
High risk HPV: NEGATIVE

## 2021-03-20 NOTE — Progress Notes (Signed)
   CC: Weight loss; pap smear  HPI:  [name] is a 55 y.o. with a PMHx as listed below who presents to the clinic for Weight loss; pap smear.   Please see the Encounters tab for problem-based Assessment & Plan regarding status of patient's acute and chronic conditions.  Past Medical History:  Diagnosis Date   Allergy    Asthma    Essential hypertension    Review of Systems: Review of Systems  Constitutional:  Negative for chills, fever and weight loss.  Respiratory:  Negative for cough, shortness of breath and wheezing.   Cardiovascular:  Negative for chest pain, palpitations and leg swelling.  Gastrointestinal:  Negative for abdominal pain, diarrhea, nausea and vomiting.  Genitourinary:  Negative for dysuria, frequency and urgency.  Musculoskeletal:  Negative for back pain, joint pain, myalgias and neck pain.  Neurological:  Negative for dizziness, focal weakness, weakness and headaches.   Physical Exam:  Vitals:   03/19/21 1513  BP: (!) 139/41  Pulse: 84  Temp: 98.4 F (36.9 C)  TempSrc: Oral  SpO2: 100%  Weight: 197 lb 12.8 oz (89.7 kg)  Height: 5\' 5"  (1.651 m)   Physical Exam Vitals and nursing note reviewed. Exam conducted with a chaperone present.  Constitutional:      General: She is not in acute distress.    Appearance: She is obese.  HENT:     Head: Normocephalic and atraumatic.     Mouth/Throat:     Mouth: Mucous membranes are moist.     Pharynx: Oropharynx is clear.  Eyes:     Extraocular Movements: Extraocular movements intact.     Conjunctiva/sclera: Conjunctivae normal.     Pupils: Pupils are equal, round, and reactive to light.  Cardiovascular:     Rate and Rhythm: Normal rate and regular rhythm.     Heart sounds: No murmur heard. Pulmonary:     Effort: Pulmonary effort is normal. No respiratory distress.     Breath sounds: Normal breath sounds. No wheezing or rales.  Abdominal:     General: Bowel sounds are normal. There is no  distension.     Palpations: Abdomen is soft.     Tenderness: There is no abdominal tenderness. There is no guarding.  Genitourinary:    General: Normal vulva.     Labia:        Right: No rash or lesion.        Left: No rash or lesion.      Vagina: No signs of injury. Vaginal discharge (normal appearing) present. No tenderness.     Cervix: Dilated. No discharge, friability, lesion or cervical bleeding.  Musculoskeletal:        General: No deformity or signs of injury.     Right lower leg: No edema.     Left lower leg: No edema.  Skin:    General: Skin is warm and dry.  Neurological:     General: No focal deficit present.     Mental Status: She is alert and oriented to person, place, and time. Mental status is at baseline.  Psychiatric:        Mood and Affect: Mood normal.        Behavior: Behavior normal.        Thought Content: Thought content normal.        Judgment: Judgment normal.    Assessment & Plan:   See Encounters Tab for problem based charting.  Patient discussed with Dr. Heber [place]

## 2021-03-20 NOTE — Assessment & Plan Note (Signed)
Historically well controlled with diet and lifestyle changes only.  Patient's blood pressure today is very mildly elevated at 139/41.  I suspect this is due to patient's weight gain over the last year.  Given that we are starting weight loss medication at this time, will hold off on any antihypertensives.  -Continue to monitor blood pressure regularly

## 2021-03-20 NOTE — Progress Notes (Signed)
Patient called.  Left message for patient to call back. Pap results WNL. Next pap due in July 2027.

## 2021-03-20 NOTE — Assessment & Plan Note (Signed)
Margaret Pacheco states that she was previously on phentermine and topiramate and had lost approximately 25 pounds.  After a 80-month trial, she was transition to [place] (liraglutide) and since then has gained back approximately 15 pounds.  She continues to workout regularly and work on her diet but has not been successful in weight loss.  She would like to discuss restarting phentermine today.  Assessment/plan: Given patient's tremendous success with phentermine and topiramate in the past, it is not unreasonable to restart a 83-month trial.  It appears patient is currently on maximum dose of Saxenda and has gained weight significantly over the last year since stopping phentermine.  No contraindications to phentermine at this time given that blood pressure is within an acceptable range.  We will follow-up in 1 month to monitor her progress of blood pressure.  - Start phentermine 15 mg daily - Start topiramate 50 mg daily - Stop Saxenda - 1 month follow-up

## 2021-03-22 NOTE — Progress Notes (Signed)
Internal Medicine Clinic Attending  Case discussed with Dr. Basaraba  At the time of the visit.  We reviewed the resident's history and exam and pertinent patient test results.  I agree with the assessment, diagnosis, and plan of care documented in the resident's note.  

## 2021-04-19 ENCOUNTER — Encounter: Payer: Self-pay | Admitting: Internal Medicine

## 2021-05-16 ENCOUNTER — Encounter: Payer: Self-pay | Admitting: Internal Medicine

## 2021-05-16 ENCOUNTER — Other Ambulatory Visit: Payer: Self-pay | Admitting: *Deleted

## 2021-05-16 MED ORDER — PHENTERMINE HCL 15 MG PO CAPS: 15.0000 mg | ORAL_CAPSULE | ORAL | 1 refills | Status: DC

## 2021-05-16 MED ORDER — TOPIRAMATE 50 MG PO TABS: 50.0000 mg | ORAL_TABLET | Freq: Every day | ORAL | 2 refills | Status: DC

## 2021-05-16 NOTE — Telephone Encounter (Signed)
Pt wanted to make MD aware of BP 123/80

## 2021-05-16 NOTE — Telephone Encounter (Signed)
Reviewed Dr.Basaraba's treatment plan. Refilled medications. Please have Ms. Hoge follow up in 2 months in clinic.

## 2021-05-16 NOTE — Telephone Encounter (Signed)
Pt calling with BP reading and medication refill request BP 123/80

## 2021-06-11 ENCOUNTER — Encounter: Payer: Self-pay | Admitting: Internal Medicine

## 2021-06-11 ENCOUNTER — Other Ambulatory Visit: Payer: Self-pay

## 2021-06-11 ENCOUNTER — Ambulatory Visit: Payer: BC Managed Care – PPO | Admitting: Internal Medicine

## 2021-06-11 VITALS — BP 133/83 | HR 74 | Temp 98.1°F | Ht 64.0 in | Wt 187.5 lb

## 2021-06-11 DIAGNOSIS — Z0000 Encounter for general adult medical examination without abnormal findings: Secondary | ICD-10-CM | POA: Diagnosis not present

## 2021-06-11 DIAGNOSIS — E559 Vitamin D deficiency, unspecified: Secondary | ICD-10-CM

## 2021-06-11 DIAGNOSIS — Z1231 Encounter for screening mammogram for malignant neoplasm of breast: Secondary | ICD-10-CM

## 2021-06-11 DIAGNOSIS — I10 Essential (primary) hypertension: Secondary | ICD-10-CM | POA: Diagnosis not present

## 2021-06-11 DIAGNOSIS — Z1211 Encounter for screening for malignant neoplasm of colon: Secondary | ICD-10-CM | POA: Diagnosis not present

## 2021-06-11 NOTE — Progress Notes (Signed)
   Office Visit   Patient ID: Margaret Pacheco, female    DOB: 01/31/1966, 55 y.o.   MRN: [id number]   PCP: Madalyn Rob, MD   Subjective:  Margaret Pacheco is a 55 y.o. year old female who presents for obesity, hypertension and healthcare maintenance. Please refer to problem based charting for assessment and plan.    Objective:   BP 133/83 (BP Location: Left Arm, Patient Position: Sitting, Cuff Size: Normal)   Pulse 74   Temp 98.1 F (36.7 C) (Oral)   Ht 5\' 4"  (1.626 m)   Wt 187 lb 8 oz (85 kg)   SpO2 100%   BMI 32.18 kg/m  BP Readings from Last 3 Encounters:  06/11/21 133/83  03/19/21 (!) 139/91  06/29/19 140/85   General: well appearing, no distress Cardiac: RRR Pulm: lungs clear  Assessment & Plan:   Problem List Items Addressed This Visit       Cardiovascular and Mediastinum   Essential hypertension    Blood pressure remains at goal in the office today. Continue regular monitoring.         Other   Vitamin D deficiency    She was encouraged to start oral vitamin D. Can consider rechecking a vitamin D level when she follows up with her PCP      Obesity, morbid ([place])    Weight is down 10lb from July, since starting phentermine and topamax. Continue current treatment. Follow up with PCP.      Preventative health care    Flu vaccine and mammogram ordered today. She has been delaying CRC screening with colonoscopy due to her work schedule. No known CRC risk factors. Discussed alternative options  including cologuard and FIT testing with understanding that an abnormal test will require a diagnostic colonoscopy. She has elected to proceed with cologuard which I ordered today. I recommended she reach out to [facility] to ensure coverage.       Other Visit Diagnoses     Colon cancer screening    -  Primary   Relevant Orders   Cologuard   Encounter for screening mammogram for malignant neoplasm of breast       Relevant Orders   MM Digital Screening      Return in  about 10 weeks (around 08/20/2021) for follow up with Dr. Court Joy.   Pt discussed with Dr. Venetia Maxon, MD Internal Medicine Resident PGY-3 [facility] 06/11/2021 3:14 PM

## 2021-06-11 NOTE — Assessment & Plan Note (Signed)
Weight is down 10lb from July, since starting phentermine and topamax. Continue current treatment. Follow up with PCP.

## 2021-06-11 NOTE — Assessment & Plan Note (Addendum)
Flu vaccine and mammogram ordered today. She has been delaying CRC screening with colonoscopy due to her work schedule. No known CRC risk factors. Discussed alternative options  including cologuard and FIT testing with understanding that an abnormal test will require a diagnostic colonoscopy. She has elected to proceed with cologuard which I ordered today. I recommended she reach out to [facility] to ensure coverage.

## 2021-06-11 NOTE — Assessment & Plan Note (Signed)
She was encouraged to start oral vitamin D. Can consider rechecking a vitamin D level when she follows up with her PCP

## 2021-06-11 NOTE — Progress Notes (Signed)
Internal Medicine Clinic Attending  Case discussed with Dr. Christian  At the time of the visit.  We reviewed the resident's history and exam and pertinent patient test results.  I agree with the assessment, diagnosis, and plan of care documented in the resident's note.  

## 2021-06-11 NOTE — Assessment & Plan Note (Addendum)
Blood pressure remains at goal in the office today. Continue regular monitoring.

## 2021-07-20 ENCOUNTER — Encounter: Payer: Self-pay | Admitting: Internal Medicine

## 2021-07-22 ENCOUNTER — Telehealth: Payer: Self-pay

## 2021-07-22 ENCOUNTER — Telehealth: Payer: Self-pay | Admitting: *Deleted

## 2021-07-22 ENCOUNTER — Other Ambulatory Visit: Payer: Self-pay

## 2021-07-22 MED ORDER — PHENTERMINE HCL 15 MG PO CAPS: 15.0000 mg | ORAL_CAPSULE | ORAL | 0 refills | Status: DC

## 2021-07-22 NOTE — Telephone Encounter (Signed)
-----   Message from Madalyn Rob, MD sent at 07/22/2021  2:20 PM EST ----- Regarding: Refill, needs appoitment Refilled patients Phentermine. This medication is not recommended as long term medication. Will need to appointment before next refill so I can monitor benefit/risk with patient.

## 2021-07-22 NOTE — Telephone Encounter (Signed)
No refill request noted from pharmacy per mychart review. Patient called and informed of this.  She is requesting a refill on Phentermine.  Will forward to PCP and mark urgent, but informed patient RX may not be filled before she leaves town and to check with her pharmacy.  She verbalized understanding. SChaplin, RN,BSN    ===View-only below this line=== ----- Message ----- From: Margaret Pacheco Sent: 07/20/2021  12:32 PM EST To: Imp Triage Nurse Pool Subject: Refill request                                 I am running into the same dilemma each month on my prescription. The pharmacy ask 5 days ahead for refill request authorization from a physician from IM @ [facility]. I will be out on Monday. I have a trip planned and my flight leaves on Monday afternoon. Is it at all possible to get this taken care of so I can pick up the one prescription by noon on Monday ?

## 2021-07-22 NOTE — Telephone Encounter (Signed)
Called pt - no answer; left message to call the office . 

## 2021-07-22 NOTE — Telephone Encounter (Signed)
PA  for pt ( PHENTERMINE HCL 15 MG CAP )  came through on cover my meds  was done and sent back with office notes from 7/5... awaiting approval or denial

## 2021-07-23 NOTE — Telephone Encounter (Signed)
DECISION :   Denied on November 7  Your PA request has been denied.     ( WAS REDONE AND SENT BACK)  awaiting approval or denial

## 2021-07-23 NOTE — Telephone Encounter (Signed)
Called pt - "call cannot be completed at this time", unable to leave a message. I will send her a My Chart message.

## 2021-07-29 NOTE — Telephone Encounter (Signed)
DECISION :   Was ran for 365 days   Approved on November 12  Your PA request has been approved.   Additional information will be provided in the approval communication. (Message [phone])  Drug  Phentermine HCl 15MG  capsules  Form  [employer] Electronic PA Form [phone] NCPDP)     ( COPY WAS ALSO SENT TO PHARMACY )

## 2021-08-28 ENCOUNTER — Other Ambulatory Visit: Payer: Self-pay | Admitting: *Deleted

## 2021-08-28 NOTE — Telephone Encounter (Signed)
Next appt scheduled 09/03/21 with PCP.

## 2021-08-29 MED ORDER — PHENTERMINE HCL 15 MG PO CAPS: 15.0000 mg | ORAL_CAPSULE | ORAL | 0 refills | Status: DC

## 2021-08-29 NOTE — Telephone Encounter (Signed)
Received fax from [facility] requesting refill on phentermine 15 mg caps.

## 2021-09-03 ENCOUNTER — Encounter: Payer: Self-pay | Admitting: Internal Medicine

## 2021-09-03 ENCOUNTER — Ambulatory Visit (INDEPENDENT_AMBULATORY_CARE_PROVIDER_SITE_OTHER): Payer: BC Managed Care – PPO | Admitting: Internal Medicine

## 2021-09-03 MED ORDER — TOPIRAMATE 50 MG PO TABS: 50.0000 mg | ORAL_TABLET | Freq: Every day | ORAL | 0 refills | Status: DC

## 2021-09-03 MED ORDER — PHENTERMINE HCL 15 MG PO CAPS: 15.0000 mg | ORAL_CAPSULE | ORAL | 0 refills | Status: DC

## 2021-09-03 NOTE — Assessment & Plan Note (Signed)
Wt Readings from Last 3 Encounters:  09/03/21 179 lb 4.8 oz (81.3 kg)  06/11/21 187 lb 8 oz (85 kg)  03/19/21 197 lb 12.8 oz (89.7 kg)   Patient here for follow up after starting Phentermine and Topirimate for weight loss in July. As mentioned previously patient did not find benefit on Liraglutide. 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 She walks daily in gym at work, but prefers walking outdoors when weather is better. She reports good portion control. Patient is not quite a 6 month mark and almost achieved weight loss goal of BMI <30. We will continue and have patient follow up in 1-2 months. Discussed Tirzepatide briefly, this a possible option if patient needs further assistance with weight loss.  18-lbs weight loss since starting medication with no side effects.   Assessment/Plan: Obesity - phentermine 15 MG capsule; Take 1 capsule (15 mg total) by mouth every morning.  Dispense: 60 capsule; Refill: 0 - topiramate (TOPAMAX) 50 MG tablet; Take 1 tablet (50 mg total) by mouth daily.  Dispense: 60 tablet; Refill: 0

## 2021-09-03 NOTE — Patient Instructions (Addendum)
Thank you for trusting me with your care. To recap, today we discussed the following:  Refilled medication for 2 months. Follow up in 2 months. If additional weight loss is needed we can start another medication.  - phentermine 15 MG capsule; Take 1 capsule (15 mg total) by mouth every morning.  Dispense: 60 capsule; Refill: 0

## 2021-09-03 NOTE — Progress Notes (Signed)
° °  CC: obesity  HPI:Margaret Pacheco is a 55 y.o. female who presents for evaluation of obesity. Please see individual problem based A/P for details.   Past Medical History:  Diagnosis Date   Allergy    Asthma    Essential hypertension    Review of Systems:   Review of Systems  Constitutional:  Positive for weight loss.  Respiratory:  Negative for shortness of breath.   Cardiovascular:  Negative for chest pain and palpitations.  Neurological:  Negative for dizziness and headaches.    Physical Exam: Vitals:   09/03/21 0853  BP: 124/82  Pulse: 75  Weight: 179 lb 4.8 oz (81.3 kg)   General: Well-kept, no acute distress HEENT: Conjunctiva nl , antiicteric sclerae, moist mucous membranes, no exudate or erythema Cardiovascular: Normal rate, regular rhythm.  No murmurs, rubs, or gallops Pulmonary : Equal breath sounds, No wheezes, rales, or rhonchi Abdominal: soft, nontender,  bowel sounds present Ext: No edema in lower extremities, no tenderness to palpation of lower extremities.   Assessment & Plan:   See Encounters Tab for problem based charting.  Patient discussed with Dr.  Saverio Danker

## 2021-09-04 NOTE — Progress Notes (Signed)
Internal Medicine Clinic Attending  Case discussed with Dr. Court Joy  At the time of the visit.  We reviewed the residents history and exam and pertinent patient test results.  I agree with the assessment, diagnosis, and plan of care documented in the residents note. Significant weight loss on current regimen and no adverse side effects, okay to continue for additional 1-2 months to attempt to reach weight loss goals. At that time, we would recommend medication break to assess weight maintenance off medication and continued discussion of lifestyle management.

## 2021-10-02 ENCOUNTER — Other Ambulatory Visit: Payer: Self-pay | Admitting: *Deleted

## 2021-10-06 MED ORDER — TOPIRAMATE 50 MG PO TABS: 50.0000 mg | ORAL_TABLET | Freq: Every day | ORAL | 0 refills | Status: DC

## 2021-10-28 ENCOUNTER — Telehealth: Payer: Self-pay

## 2021-10-30 NOTE — Telephone Encounter (Signed)
Patient sent message via my chart of needing an appointment before medication will be refiled per Dr. Court Joy.

## 2021-11-25 ENCOUNTER — Other Ambulatory Visit: Payer: Self-pay

## 2021-11-25 ENCOUNTER — Ambulatory Visit
Admission: EM | Admit: 2021-11-25 | Discharge: 2021-11-25 | Disposition: A | Discharge status: Home / Self Care | Payer: BC Managed Care – PPO | Attending: Physician Assistant | Admitting: Physician Assistant

## 2021-11-25 DIAGNOSIS — R350 Frequency of micturition: Secondary | ICD-10-CM | POA: Insufficient documentation

## 2021-11-25 LAB — URINALYSIS, ROUTINE W REFLEX MICROSCOPIC
Bilirubin Urine: NEGATIVE
Glucose, UA: NEGATIVE mg/dL
Ketones, ur: NEGATIVE mg/dL
Nitrite: NEGATIVE
Protein, ur: NEGATIVE mg/dL
Specific Gravity, Urine: 1.02 (ref 1.005–1.030)
pH: 7 (ref 5.0–8.0)

## 2021-11-25 LAB — URINALYSIS, MICROSCOPIC (REFLEX)

## 2021-11-25 MED ORDER — NITROFURANTOIN MONOHYD MACRO 100 MG PO CAPS: 100.0000 mg | ORAL_CAPSULE | Freq: Two times a day (BID) | ORAL | 0 refills | Status: DC

## 2021-11-25 MED ORDER — LIDOCAINE VISCOUS HCL 2 % MT SOLN: 15.0000 mL | OROMUCOSAL | 0 refills | Status: DC | PRN

## 2021-11-25 MED ORDER — CLINDAMYCIN HCL 300 MG PO CAPS: 300.0000 mg | ORAL_CAPSULE | Freq: Three times a day (TID) | ORAL | 0 refills | Status: DC

## 2021-11-25 MED ORDER — OXYCODONE HCL 10 MG PO TABS: 10.0000 mg | ORAL_TABLET | Freq: Four times a day (QID) | ORAL | 0 refills | Status: DC | PRN

## 2021-11-25 NOTE — ED Provider Notes (Signed)
?[place] ? ? ? ?CSN: [id number] ?Arrival date & time: 11/25/21  1650 ? ? ?  ? ?History   ?Chief Complaint ?Chief Complaint  ?Patient presents with  ? Urinary Urgency  ? ? ?HPI ?Margaret Pacheco is a 56 y.o. female.  ? ?Patient presents with urinary frequency and urgency for 1 day.  Dors is there is a discomfort present when starting stream . denies hematuria, abdominal pain, flank pain, fever, chills, vaginal discharge, itching or odor.  Has not attempted treatment of symptoms.   ? ?Past Medical History:  ?Diagnosis Date  ? Allergy   ? Asthma   ? Essential hypertension   ? ? ?Patient Active Problem List  ? Diagnosis Date Noted  ? Post-nasal drip 06/06/2019  ? Proteinuria 11/24/2017  ? Preventative health care 01/15/2016  ? Essential hypertension 01/15/2016  ? Obesity, morbid ([place]) 12/11/2015  ? Vitamin D deficiency 09/26/2010  ? ? ?History reviewed. No pertinent surgical history. ? ?OB History   ?No obstetric history on file. ?  ? ? ? ?Home Medications   ? ?Prior to Admission medications   ?Medication Sig Start Date End Date Taking? Authorizing Provider  ?fluticasone (FLONASE) 50 MCG/ACT nasal spray Place 2 sprays into both nostrils 2 (two) times daily. 06/06/19 07/06/19  Ladona Horns, MD  ?Insulin Pen Needle (PEN NEEDLES) 31G X 5 MM MISC 1 Units by Does not apply route daily. Use to inject  medication into the skin daily. DIAG CODE E66.01 09/17/20   Modena Nunnery D, DO  ?phentermine 15 MG capsule Take 1 capsule (15 mg total) by mouth every morning. 09/03/21   Madalyn Rob, MD  ?topiramate (TOPAMAX) 50 MG tablet Take 1 tablet (50 mg total) by mouth daily. 10/06/21 10/06/22  Madalyn Rob, MD  ? ? ?Family History ?Family History  ?Problem Relation Age of Onset  ? Heart disease Mother   ? Cancer Father   ? ? ?Social History ?Social History  ? ?Tobacco Use  ? Smoking status: Never  ? Smokeless tobacco: Never  ?Vaping Use  ? Vaping Use: Never used  ?Substance Use Topics  ? Alcohol use: No  ?  Alcohol/week: 0.0  standard drinks  ? Drug use: No  ? ? ? ?Allergies   ?Norethin-eth estrad triphasic and Penicillins ? ? ?Review of Systems ?Review of Systems  ?Constitutional: Negative.   ?Respiratory: Negative.    ?Cardiovascular: Negative.   ?Genitourinary:  Positive for dysuria, frequency and urgency. Negative for decreased urine volume, difficulty urinating, dyspareunia, enuresis, flank pain, genital sores, hematuria, menstrual problem, pelvic pain, vaginal bleeding, vaginal discharge and vaginal pain.  ?Skin: Negative.   ? ? ?Physical Exam ?Triage Vital Signs ?ED Triage Vitals  ?Enc Vitals Group  ?   BP 11/25/21 1712 132/80  ?   Pulse Rate 11/25/21 1712 70  ?   Resp 11/25/21 1712 16  ?   Temp 11/25/21 1712 98.1 ?F (36.7 ?C)  ?   Temp Source 11/25/21 1712 Oral  ?   SpO2 11/25/21 1712 100 %  ?   Weight 11/25/21 1707 170 lb (77.1 kg)  ?   Height 11/25/21 1707 '5\' 5"'$  (1.651 m)  ?   Head Circumference --   ?   Peak Flow --   ?   Pain Score 11/25/21 1707 0  ?   Pain Loc --   ?   Pain Edu? --   ?   Excl. in [place]? --   ? ?No data found. ? ?Updated Vital  Signs ?BP 132/80 (BP Location: Right Arm)   Pulse 70   Temp 98.1 ?F (36.7 ?C) (Oral)   Resp 16   Ht '5\' 5"'$  (1.651 m)   Wt 170 lb (77.1 kg)   SpO2 100%   BMI 28.29 kg/m?  ? ?Visual Acuity ?Right Eye Distance:   ?Left Eye Distance:   ?Bilateral Distance:   ? ?Right Eye Near:   ?Left Eye Near:    ?Bilateral Near:    ? ?Physical Exam ?Constitutional:   ?   Appearance: Normal appearance.  ?HENT:  ?   Head: Normocephalic.  ?Eyes:  ?   Extraocular Movements: Extraocular movements intact.  ?Pulmonary:  ?   Effort: Pulmonary effort is normal.  ?Abdominal:  ?   General: Abdomen is flat. Bowel sounds are normal. There is no distension.  ?   Palpations: Abdomen is soft.  ?   Tenderness: There is no abdominal tenderness. There is no right CVA tenderness or left CVA tenderness.  ?Skin: ?   General: Skin is warm and dry.  ?Neurological:  ?   Mental Status: She is alert and oriented to person,  place, and time. Mental status is at baseline.  ?Psychiatric:     ?   Mood and Affect: Mood normal.     ?   Behavior: Behavior normal.  ? ? ? ?UC Treatments / Results  ?Labs ?(all labs ordered are listed, but only abnormal results are displayed) ?Labs Reviewed  ?URINALYSIS, ROUTINE W REFLEX MICROSCOPIC  ? ? ?EKG ? ? ?Radiology ?No results found. ? ?Procedures ?Procedures (including critical care time) ? ?Medications Ordered in UC ?Medications - No data to display ? ?Initial Impression / Assessment and Plan / UC Course  ?I have reviewed the triage vital signs and the nursing notes. ? ?Pertinent labs & imaging results that were available during my care of the patient were reviewed by me and considered in my medical decision making (see chart for details). ? ?Urinary frequency ? ?Urinalysis showing trace Ayza Ripoll blood cells and few bacteria, sent for culture, discussed findings with patient while urinalysis is not indicative of a infection, will treat prophylactically based on symptomology, nitrofurantoin 5-day course prescribed, recommend increase and fluid intake as well as good feminine hygiene, may follow-up with urgent care as needed for persisting symptoms ?Final Clinical Impressions(s) / UC Diagnoses  ? ?Final diagnoses:  ?None  ? ? ? ?Discharge Instructions   ? ?  ?Take clindamycin 3 times a day for the next 7 days ? ? ?ED Prescriptions   ? ? Medication Sig Dispense Auth. Provider  ? clindamycin (CLEOCIN) 300 MG capsule  (Status: Discontinued) Take 1 capsule (300 mg total) by mouth 3 (three) times daily for 7 days. 21 capsule Sherryann Frese R, NP  ? oxyCODONE 10 MG TABS  (Status: Discontinued) Take 1 tablet (10 mg total) by mouth every 6 (six) hours as needed for up to 5 days for severe pain. 20 tablet Marilla Boddy R, NP  ? lidocaine (XYLOCAINE) 2 % solution  (Status: Discontinued) Use as directed 15 mLs in the mouth or throat as needed. 100 mL Hans Eden, NP  ? ?  ? ?I have reviewed the PDMP during  this encounter. ?  ?Hans Eden, NP ?11/25/21 1856 ? ?

## 2021-11-25 NOTE — Discharge Instructions (Addendum)
This showed a trace amount of Margaret Pacheco blood cells and few bacteria without presence of nitrates which is not indicative of a urinary infection, your urine sample has been sent to the lab to determine if bacteria will grow, if any changes need to be made to your medicine or we no longer feel you need to continue the antibiotic you will be notified ? ?Begin use of nitrofurantoin taking twice daily for the next 5 days ? ?There is a prescription for clindamycin at the pharmacy for you which was sent in error, do not take this medicine ? ?Increase fluid intake to help further flush kidneys and bladder ? ?As always practice good feminine hygiene, wiping from front to back, urinating after sexual encounters and using unscented products in the vaginal area ? ?If symptoms continue to persist past use of medication you may follow-up with your urgent care or primary doctor as needed ?

## 2021-11-25 NOTE — ED Triage Notes (Signed)
Pt started with urinary urgency yesterday and started today with some pain with urination ?

## 2021-11-27 LAB — URINE CULTURE: Culture: NO GROWTH

## 2021-11-29 ENCOUNTER — Other Ambulatory Visit: Payer: Self-pay | Admitting: Internal Medicine

## 2021-11-29 ENCOUNTER — Encounter: Payer: Self-pay | Admitting: Internal Medicine

## 2021-11-30 NOTE — Telephone Encounter (Signed)
Patient has an upcoming appointment with Dr.Bannano. She will make decision if refill is appropriate at office visit. I will refuse this refill at this time. See Dr.Lau's attestation and my note from last office visit for details.  ?

## 2021-12-02 ENCOUNTER — Encounter: Payer: BC Managed Care – PPO | Admitting: Internal Medicine

## 2021-12-02 NOTE — Telephone Encounter (Signed)
Called pt - stated she has already scheduled an appt for tomorrow 3/21 @ 1515PM. ?

## 2021-12-03 ENCOUNTER — Other Ambulatory Visit (HOSPITAL_COMMUNITY): Payer: Self-pay

## 2021-12-03 ENCOUNTER — Ambulatory Visit: Payer: BC Managed Care – PPO | Admitting: Internal Medicine

## 2021-12-03 DIAGNOSIS — I10 Essential (primary) hypertension: Secondary | ICD-10-CM

## 2021-12-03 MED ORDER — TOPIRAMATE 50 MG PO TABS: 50.0000 mg | ORAL_TABLET | Freq: Every day | ORAL | 0 refills | Status: DC

## 2021-12-03 MED ORDER — PHENTERMINE HCL 15 MG PO CAPS: 15.0000 mg | ORAL_CAPSULE | ORAL | 0 refills | Status: DC

## 2021-12-03 NOTE — Patient Instructions (Signed)
Thank you, Margaret Pacheco for allowing [place] to provide your care today. Today we discussed your weight loss. ? ?Will we continue phentermine/topiramate for now while I look into what weekly medication (Ozempic, Mounjaro) will be covered by your insurance. I will contact you with updates after I talked to our pharmacy tech. ? ?I have ordered the following labs for you: ? ?Lab Orders  ?No laboratory test(s) ordered today  ?  ?Referrals ordered today:  ? ?Referral Orders  ?No referral(s) requested today  ?  ? ?I have ordered the following medication/changed the following medications:  ? ?Stop the following medications: ?Medications Discontinued During This Encounter  ?Medication Reason  ? phentermine 15 MG capsule Reorder  ? topiramate (TOPAMAX) 50 MG tablet Reorder  ?  ? ?Start the following medications: ?Meds ordered this encounter  ?Medications  ? phentermine 15 MG capsule  ?  Sig: Take 1 capsule (15 mg total) by mouth every morning.  ?  Dispense:  30 capsule  ?  Refill:  0  ? topiramate (TOPAMAX) 50 MG tablet  ?  Sig: Take 1 tablet (50 mg total) by mouth daily.  ?  Dispense:  30 tablet  ?  Refill:  0  ?  ? ?Follow up:  1-2 months   ? ? ?Should you have any questions or concerns please call the internal medicine clinic at [phone].   ? ? ? ?

## 2021-12-03 NOTE — Assessment & Plan Note (Signed)
Blood pressures are at goal.  We will continue to monitor. ?

## 2021-12-03 NOTE — Assessment & Plan Note (Signed)
Wt Readings from Last 3 Encounters:  ?12/03/21 178 lb 9.6 oz (81 kg)  ?11/25/21 170 lb (77.1 kg)  ?09/03/21 179 lb 4.8 oz (81.3 kg)  ? ?The patient is here today for routine follow-up.  She has been on phentermine and topiramate for weight loss.  Review of records shows that she has had 25 pound weight loss with these medications.  During her last visit in December 2022, discussed tirzepatide as a possible option for weight loss. ? ?Plan: ?We will continue current medication regimen for now, however will start transition to weekly injections with Ozempic or Mounjaro for example.  We will need to see whether this can be covered by her insurance.  We will contact the patient with plan and follow-up in 1-2 months. ?

## 2021-12-03 NOTE — Progress Notes (Signed)
? ?  CC: routine follow-up ? ?HPI: ? ?[name] is a 56 y.o. with past medical history as noted below who presents to the clinic today for a routine follow-up. Please see problem-based list for further details, assessments, and plans.  ? ?Past Medical History:  ?Diagnosis Date  ? Allergy   ? Asthma   ? Essential hypertension   ? ?Review of Systems: Negative aside from that listed in individualized problem based charting.  ? ?Physical Exam: ? ?Vitals:  ? 12/03/21 1538  ?BP: 136/83  ?Pulse: 85  ?Temp: 98 ?F (36.7 ?C)  ?TempSrc: Oral  ?SpO2: 100%  ?Weight: 178 lb 9.6 oz (81 kg)  ?Height: '5\' 4"'$  (1.626 m)  ? ?General: NAD, nl appearance ?HE: Normocephalic, atraumatic, EOMI, Conjunctivae normal ?ENT: No congestion, no rhinorrhea, no exudate or erythema  ?Cardiovascular: Normal rate, regular rhythm. No murmurs, rubs, or gallops ?Pulmonary: Effort normal, breath sounds normal. No wheezes, rales, or rhonchi ?Abdominal: soft, nontender, bowel sounds present ?Musculoskeletal: no swelling, deformity, injury or tenderness in extremities ?Skin: Warm, dry, no bruising, erythema, or rash ?Psychiatric/Behavioral: normal mood, normal behavior    ? ? ?Assessment & Plan:  ? ?See Encounters Tab for problem based charting. ? ?Patient discussed with Dr. Dareen Piano ? ?

## 2021-12-04 ENCOUNTER — Telehealth: Payer: Self-pay

## 2021-12-04 NOTE — Telephone Encounter (Signed)
Pa renewal for pt ( PHENTERMINE ) came through on cover my meds was submitted awaiting approval or denial  ?

## 2021-12-05 NOTE — Telephone Encounter (Signed)
DECISION : ? ? ? ? ?DENIED  ? ?You do not meet the requirements of your plan. ?Your plan covers this drug when you have not received 3 months of therapy with the ?requested drug within the past year. ?Your request has been denied based on the information we have. ? ? ? ? ? ( From the looks of it the patient can not be on the medication for a long period of time because the chart notes does say she was on the med and has lost some weight  while on the medicine )  ? ? ? ( [place] )  ? ? ? ? ? ?

## 2021-12-06 NOTE — Progress Notes (Signed)
Internal Medicine Clinic Attending ° °Case discussed with Dr. Bonanno  °  At the time of the visit.  We reviewed the resident’s history and exam and pertinent patient test results.  I agree with the assessment, diagnosis, and plan of care documented in the resident’s note. ° °

## 2021-12-31 ENCOUNTER — Encounter: Payer: Self-pay | Admitting: Internal Medicine

## 2022-01-01 ENCOUNTER — Telehealth: Payer: Self-pay | Admitting: Internal Medicine

## 2022-01-01 MED ORDER — TOPIRAMATE 50 MG PO TABS: 50.0000 mg | ORAL_TABLET | Freq: Every day | ORAL | 2 refills | Status: DC

## 2022-01-01 MED ORDER — PHENTERMINE HCL 15 MG PO CAPS: 15.0000 mg | ORAL_CAPSULE | ORAL | 2 refills | Status: DC

## 2022-01-01 NOTE — Telephone Encounter (Signed)
Patient called to discuss weight loss medications. This was signed out last month to attending and plan to continue these medications. Other options like Mounjaro and ozempic not covered by insurance. We discussed my recommendation to not use this as a long term strategy for weight loss. Patien would like to continue until her next routine appointment in 3 to 6 months.We can reevaluate at that time.  ?

## 2022-04-03 ENCOUNTER — Other Ambulatory Visit: Payer: Self-pay

## 2022-04-03 MED ORDER — TOPIRAMATE 50 MG PO TABS: 50.0000 mg | ORAL_TABLET | Freq: Every day | ORAL | 2 refills | Status: DC

## 2022-04-08 ENCOUNTER — Other Ambulatory Visit: Payer: Self-pay | Admitting: Student

## 2022-04-08 ENCOUNTER — Encounter: Payer: Self-pay | Admitting: Internal Medicine

## 2022-04-08 MED ORDER — PHENTERMINE HCL 15 MG PO CAPS: 15.0000 mg | ORAL_CAPSULE | ORAL | 0 refills | Status: DC

## 2022-04-22 ENCOUNTER — Encounter: Payer: Self-pay | Admitting: Student

## 2022-04-22 ENCOUNTER — Ambulatory Visit: Payer: BC Managed Care – PPO | Admitting: Student

## 2022-04-22 DIAGNOSIS — Z6830 Body mass index (BMI) 30.0-30.9, adult: Secondary | ICD-10-CM

## 2022-04-22 MED ORDER — PHENTERMINE HCL 15 MG PO CAPS: 15.0000 mg | ORAL_CAPSULE | ORAL | 0 refills | Status: DC

## 2022-04-22 NOTE — Patient Instructions (Addendum)
  Thank you, Ms.Suzi N Rodriquez, for allowing [place] to provide your care today. Today we discussed . . .  > Weight loss       - keep up the great work exercising for 1.5 hours about 4-5 times per week!       - your diet looks good overall, try to focus on cutting out snacks so that your body will learn to burn fat       - we have refilled your phentermine and will refill topiramate in about two months time      I have ordered the following labs for you:  Lab Orders  No laboratory test(s) ordered today      Tests ordered today:  None   Referrals ordered today:   Referral Orders  No referral(s) requested today      I have ordered the following medication/changed the following medications:   Stop the following medications: Medications Discontinued During This Encounter  Medication Reason   phentermine 15 MG capsule Reorder     Start the following medications: Meds ordered this encounter  Medications   phentermine 15 MG capsule    Sig: Take 1 capsule (15 mg total) by mouth every morning.    Dispense:  30 capsule    Refill:  0      Follow up: 4 months    Remember: Please continue to exercise and eat a healthy diet containing lots of whole, plant-based foods. Do your best to minimize snacks and allow yourself to feel hungry for an hour or two each day. We will see you in about four months. Good luck!   Should you have any questions or concerns please call the internal medicine clinic at [phone].     Roswell Nickel, MD [place]

## 2022-04-22 NOTE — Progress Notes (Signed)
   CC: Weight Loss Follow-up  HPI:   [name] is a 56 y.o. female with a past medical history of hypertension, vitamin D deficiency, and obesity who presents for refill of her weight loss medications. She was last seen in [facility] on 3-21.     Past Medical History:  Diagnosis Date   Allergy    Asthma    Essential hypertension      Review of Systems:    Reports no acute complaints Denies recent illnesses, fever, chills, chest or muscular pain, bowel changes, cough, sweats    Physical Exam:  Vitals:   04/22/22 0851 04/22/22 0911  BP: 139/83 139/81  Pulse: 79 70  SpO2: 100%   Weight: 180 lb 14.4 oz (82.1 kg)   Height: 5' 4.5" (1.638 m)     General:   awake and alert, sitting comfortably in chair, cooperative, not in acute distress Lungs:   normal respiratory effort, breathing unlabored, symmetrical chest rise, no crackles or wheezing Cardiac:   regular rate and rhythm, normal S1 and S2 Neurologic:   oriented to person-place-time, moving all extremities Psychiatric:   mood and affect normal, intelligible speech    Assessment & Plan:   Obesity, morbid ([place]) Patient has a long history of obesity.  She has tried multiple medications for weight loss including phentermine, topiramate, and insulin.  She reports initial success with phentermine and topiramate, losing about 20 pounds over an eight-month period in 2022-2023.  At her last visit at [facility] back in March, she weighed 178 pounds.  Today, she weighs 180 pounds.  She has been taking phentermine and topiramate regularly, and feels as though they are both effective in helping her to lose or at least maintain weight.  She was previously interested in starting semaglutide and tirzepatide, but her insurance did not cover these medications because she is not a diabetic.  In addition to phentermine and topiramate, she exercises 1-1.5 hours for 4 to 5 days/week.  She typically uses the elliptical, stationary bike, or the outdoor  track for her exercise sessions. Her diet is relatively healthy, with little red meats or foods high in saturated fat or cholesterol and she does not drink soda. Given the frequency with which she exercises, net caloric intake should be the focal point for losing weight. Although her snacks are generally healthy, they are likely frequent enough that her body is never given the opportunity utilize lipids for fuel. Counseling on this point was given to her during the visit.  - Refill phentermine for 30d, topiramate is not due for another 2 months - Counseling provided on effective weight loss strategies including allowing the body to feel hungry - Return to [facility] in about 4 months for follow-up, evaluation, and discussion of weight loss     See Encounters Tab for problem based charting.  Patient discussed with Dr. Jimmye Norman

## 2022-04-22 NOTE — Assessment & Plan Note (Addendum)
Patient has a long history of obesity.  She has tried multiple medications for weight loss including phentermine, topiramate, and insulin.  She reports initial success with phentermine and topiramate, losing about 20 pounds over an eight-month period in 2022-2023.  At her last visit at [facility] back in March, she weighed 178 pounds.  Today, she weighs 180 pounds.  She has been taking phentermine and topiramate regularly, and feels as though they are both effective in helping her to lose or at least maintain weight.  She was previously interested in starting semaglutide and tirzepatide, but her insurance did not cover these medications because she is not a diabetic.  In addition to phentermine and topiramate, she exercises 1-1.5 hours for 4 to 5 days/week.  She typically uses the elliptical, stationary bike, or the outdoor track for her exercise sessions. Her diet is relatively healthy, with little red meats or foods high in saturated fat or cholesterol and she does not drink soda. Given the frequency with which she exercises, net caloric intake should be the focal point for losing weight. Although her snacks are generally healthy, they are likely frequent enough that her body is never given the opportunity utilize lipids for fuel. Counseling on this point was given to her during the visit.  - Refill phentermine for 30d, topiramate is not due for another 2 months - Counseling provided on effective weight loss strategies including allowing the body to feel hungry - Return to [facility] in about 4 months for follow-up, evaluation, and discussion of weight loss

## 2022-05-04 NOTE — Progress Notes (Signed)
Internal Medicine Clinic Attending  Case discussed with Dr. Jodi Mourning  At the time of the visit.  We reviewed the resident's history and exam and pertinent patient test results.  I agree with the assessment, diagnosis, and plan of care documented in the resident's note. Errata in AVS (refill of tirzepatide rather than topiramate was noted for future)

## 2022-05-09 ENCOUNTER — Other Ambulatory Visit: Payer: Self-pay | Admitting: Student

## 2022-05-12 ENCOUNTER — Other Ambulatory Visit: Payer: Self-pay | Admitting: Student

## 2022-05-12 MED ORDER — PHENTERMINE HCL 15 MG PO CAPS: ORAL_CAPSULE | ORAL | 1 refills | Status: DC

## 2022-05-12 NOTE — Telephone Encounter (Signed)
Received refill request for phentermine '15mg'$  daily  05/09/22 refill was set to "print" and never received by pharmacy     Please re-send rx electronically

## 2022-05-16 ENCOUNTER — Other Ambulatory Visit: Payer: Self-pay | Admitting: Internal Medicine

## 2022-05-18 ENCOUNTER — Encounter: Payer: Self-pay | Admitting: Student

## 2022-05-21 ENCOUNTER — Telehealth: Payer: Self-pay

## 2022-05-21 MED ORDER — PHENTERMINE HCL 15 MG PO CAPS: ORAL_CAPSULE | ORAL | 1 refills | Status: DC

## 2022-05-21 NOTE — Telephone Encounter (Signed)
Decision: Denied You do not meet the requirements of your plan Your plan covers this drug when you have not received 3 months of therapy with the requested drug within the past year Your request has been denied based on the information we have  Information packet has been placed the red team box.

## 2022-05-21 NOTE — Telephone Encounter (Signed)
Prior Authorization for patient (Phentermine) came through on cover my meds was submitted with last office notes awaiting approval or denial

## 2022-05-21 NOTE — Telephone Encounter (Signed)
Patient notified that Rx has been sent. She is very Patent attorney.

## 2022-05-26 ENCOUNTER — Telehealth: Payer: Self-pay

## 2022-05-26 NOTE — Telephone Encounter (Signed)
PA  for pt ( PHENTERMINE ) was re done  via Dr Humphrey Rolls  .Marland Kitchen Awaiting second half of questions .Marland KitchenMarland Kitchen

## 2022-05-26 NOTE — Telephone Encounter (Signed)
UPDATE :      Info was submitted with last office visit .Marland KitchenMarland Kitchen Awaiting approval or denial

## 2022-05-27 NOTE — Telephone Encounter (Signed)
DECISION :     Outcome  Approved today  Your PA request has been approved.  START DATE : 05/26/2022   END DATE : 08/25/2022    Drug Phentermine HCl '15MG'$  capsules  Form [employer] Electronic PA Form (2017 NCPDP)     ( COPY SENT TO PHARMACY ALSO )

## 2022-06-06 ENCOUNTER — Telehealth: Payer: Self-pay

## 2022-06-06 NOTE — Telephone Encounter (Signed)
#  30 with 2 refills sent 7/20. Patient notified to contact pharmacy for this refill. States she has this med as well as phentermine. Nothing needed at this time.

## 2022-06-06 NOTE — Telephone Encounter (Signed)
REFILL REQUEST ON  topiramate (TOPAMAX) 50 MG tablet.

## 2022-07-28 ENCOUNTER — Other Ambulatory Visit: Payer: Self-pay

## 2022-07-29 ENCOUNTER — Emergency Department (HOSPITAL_COMMUNITY)
Admission: EM | Admit: 2022-07-29 | Discharge: 2022-07-29 | Disposition: A | Discharge status: Home / Self Care | Payer: BC Managed Care – PPO | Attending: Emergency Medicine | Admitting: Emergency Medicine

## 2022-07-29 ENCOUNTER — Encounter (HOSPITAL_COMMUNITY): Payer: Self-pay

## 2022-07-29 ENCOUNTER — Other Ambulatory Visit: Payer: Self-pay

## 2022-07-29 DIAGNOSIS — L089 Local infection of the skin and subcutaneous tissue, unspecified: Secondary | ICD-10-CM | POA: Insufficient documentation

## 2022-07-29 DIAGNOSIS — Z794 Long term (current) use of insulin: Secondary | ICD-10-CM | POA: Insufficient documentation

## 2022-07-29 DIAGNOSIS — L02412 Cutaneous abscess of left axilla: Secondary | ICD-10-CM | POA: Diagnosis not present

## 2022-07-29 DIAGNOSIS — R2232 Localized swelling, mass and lump, left upper limb: Secondary | ICD-10-CM | POA: Diagnosis present

## 2022-07-29 MED ORDER — DOXYCYCLINE HYCLATE 100 MG PO CAPS
100.0000 mg | ORAL_CAPSULE | Freq: Two times a day (BID) | ORAL | 0 refills | Status: DC
Start: 2022-07-29 — End: 2022-09-24

## 2022-07-29 MED ORDER — LIDOCAINE HCL (PF) 1 % IJ SOLN
5.0000 mL | Freq: Once | INTRAMUSCULAR | Status: AC
  Administered 2022-07-29: 5 mL
  Filled 2022-07-29: qty 30

## 2022-07-29 NOTE — ED Provider Triage Note (Signed)
Emergency Medicine Provider Triage Evaluation Note  Margaret Pacheco , a 56 y.o. female  was evaluated in triage.  Pt complains of an abscess under left arm  Review of Systems  Positive: pain Negative: fever  Physical Exam  BP (!) 143/92   Pulse 78   Temp 98.1 F (36.7 C) (Oral)   Resp 18   SpO2 99%  Gen:   Awake, no distress   Resp:  Normal effort  MSK:   Moves extremities without difficulty  Other:  2cm swollen area under left arm  Medical Decision Making  Medically screening exam initiated at 12:13 PM.  Appropriate orders placed.  Margaret Pacheco was informed that the remainder of the evaluation will be completed by another provider, this initial triage assessment does not replace that evaluation, and the importance of remaining in the ED until their evaluation is complete.     Fransico Meadow, [place] 07/29/22 1214

## 2022-07-29 NOTE — Discharge Instructions (Signed)
Return if any problems.  Follow up with your Physician for recheck

## 2022-07-29 NOTE — ED Triage Notes (Signed)
Pt c/o boil to her left arm pit area. Raised area to arm pit noted. Pt states she has had it 3 days.

## 2022-07-29 NOTE — ED Provider Notes (Signed)
[facility] DEPT Provider Note   CSN: [id number] Arrival date & time: 07/29/22  1156     History  Chief Complaint  Patient presents with   Recurrent Skin Infections    Margaret Pacheco is a 56 y.o. female.  Patient complains of swelling to left axilla.  Patient reports area is painful and tender.  Patient reports she does not have any history of skin infections or skin abscesses  The history is provided by the patient. No language interpreter was used.       Home Medications Prior to Admission medications   Medication Sig Start Date End Date Taking? Authorizing Provider  doxycycline (VIBRAMYCIN) 100 MG capsule Take 1 capsule (100 mg total) by mouth 2 (two) times daily. 07/29/22  Yes Caryl Ada K, PA-C  fluticasone (FLONASE) 50 MCG/ACT nasal spray Place 2 sprays into both nostrils 2 (two) times daily. 06/06/19 07/06/19  Ladona Horns, MD  Insulin Pen Needle (PEN NEEDLES) 31G X 5 MM MISC 1 Units by Does not apply route daily. Use to inject  medication into the skin daily. DIAG CODE E66.01 09/17/20   [place], Nila Nephew D, DO  nitrofurantoin, macrocrystal-monohydrate, (MACROBID) 100 MG capsule Take 1 capsule (100 mg total) by mouth 2 (two) times daily. 11/25/21   Hans Eden, NP  phentermine 15 MG capsule TAKE 1 CAPSULE(15 MG) BY MOUTH EVERY MORNING 05/21/22   Axel Filler, MD  topiramate (TOPAMAX) 50 MG tablet Take 1 tablet (50 mg total) by mouth daily. 04/03/22 04/03/23  Gaylan Gerold, DO      Allergies    Norethin-eth estrad triphasic and Penicillins    Review of Systems   Review of Systems  Skin:  Positive for wound.  All other systems reviewed and are negative.   Physical Exam Updated Vital Signs BP (!) 143/92   Pulse 78   Temp 98.1 F (36.7 C) (Oral)   Resp 18   SpO2 99%  Physical Exam Vitals and nursing note reviewed.  Constitutional:      Appearance: She is well-developed.  HENT:     Head: Normocephalic.  Cardiovascular:      Rate and Rhythm: Normal rate.  Pulmonary:     Effort: Pulmonary effort is normal.  Abdominal:     General: There is no distension.  Musculoskeletal:        General: Normal range of motion.     Cervical back: Normal range of motion.     Comments: 2 cm abscess left axilla pointing, tender to palpation  Skin:    General: Skin is warm.  Neurological:     General: No focal deficit present.     Mental Status: She is alert and oriented to person, place, and time.     ED Results / Procedures / Treatments   Labs (all labs ordered are listed, but only abnormal results are displayed) Labs Reviewed - No data to display  EKG None  Radiology No results found.  Procedures .Marland KitchenIncision and Drainage  Date/Time: 07/29/2022 12:38 PM  Performed by: Fransico Meadow, PA-C Authorized by: Fransico Meadow, PA-C   Consent:    Consent obtained:  Verbal   Consent given by:  Patient   Risks, benefits, and alternatives were discussed: yes     Risks discussed:  Incomplete drainage and infection   Alternatives discussed:  No treatment Universal protocol:    Procedure explained and questions answered to patient or proxy's satisfaction: no     Immediately prior to procedure,  a time out was called: yes     Patient identity confirmed:  Verbally with patient Location:    Type:  Abscess   Size:  2   Location:  Upper extremity Pre-procedure details:    Skin preparation:  Povidone-iodine Procedure type:    Complexity:  Simple Procedure details:    Incision types:  Single straight   Wound management:  Probed and deloculated   Drainage amount:  Moderate   Wound treatment:  Wound left open Post-procedure details:    Procedure completion:  Tolerated     Medications Ordered in ED Medications  lidocaine (PF) (XYLOCAINE) 1 % injection 5 mL (5 mLs Infiltration Given by Other 07/29/22 1235)    ED Course/ Medical Decision Making/ A&P                           Medical Decision Making It has  an abscess left axilla  Risk Prescription drug management. Risk Details: MD performed see procedure note.  Patient given a prescription for doxycycline           Final Clinical Impression(s) / ED Diagnoses Final diagnoses:  Abscess of left axilla    Rx / DC Orders ED Discharge Orders          Ordered    doxycycline (VIBRAMYCIN) 100 MG capsule  2 times daily        07/29/22 1235           An After Visit Summary was printed and given to the patient.    Fransico Meadow, [place] 07/29/22 1240    Regan Lemming, MD 07/29/22 1739

## 2022-07-29 NOTE — ED Notes (Signed)
An After Visit Summary was printed and given to the patient. Discharge instructions given and no further questions at this time.  Clean, dry, and intact dressing applied to area under left arm.

## 2022-07-30 ENCOUNTER — Other Ambulatory Visit: Payer: Self-pay

## 2022-07-31 ENCOUNTER — Other Ambulatory Visit: Payer: Self-pay | Admitting: Psychiatry

## 2022-07-31 MED ORDER — PHENTERMINE HCL 15 MG PO CAPS: ORAL_CAPSULE | ORAL | 1 refills | Status: DC

## 2022-07-31 NOTE — Progress Notes (Unsigned)
Refilled phentermine per conversation with PCP Roswell Nickel, MD. PDMP seen.

## 2022-08-22 ENCOUNTER — Ambulatory Visit: Payer: BC Managed Care – PPO | Admitting: Internal Medicine

## 2022-08-22 VITALS — BP 130/83 | HR 66 | Temp 98.1°F | Ht 64.8 in | Wt 186.2 lb

## 2022-08-22 DIAGNOSIS — Z1231 Encounter for screening mammogram for malignant neoplasm of breast: Secondary | ICD-10-CM

## 2022-08-22 DIAGNOSIS — Z0000 Encounter for general adult medical examination without abnormal findings: Secondary | ICD-10-CM

## 2022-08-22 MED ORDER — WEGOVY 0.25 MG/0.5ML ~~LOC~~ SOAJ: SUBCUTANEOUS | 2 refills | Status: DC

## 2022-08-22 NOTE — Assessment & Plan Note (Addendum)
The patient presents for a 49-monthfollow-up of obesity and weight loss.  She has tried multiple medications for weight loss including phentermine, topiramate, and Victoza previously the patient reports initial success with phentermine and topiramate, but has been off of these medications for over a month and states that she has been gaining the weight back.  Back in 2022, she lost about 20 pounds over an 852-montheriod while on these medications.  The patient states she also previously had success with Victoza, but this was discontinued in 2022 and she is unsure why it was stopped.  At this time, the patient states that she would prefer to not have a daily injection and would prefer a once a week injection with WeZRAQTM The patient states that she has been exercising 1 to 1-1/2 hours a day for about 5 days/week.  She runs and walks, and also uses the elliptical and stationary bike.  Her diet is relatively healthy, stating that she eats well red meats or foods that are high in saturated fat or cholesterol.  The patient is frustrated that she has gained weight since her last visit, but does state that she feels that some of her weight could be muscle weight since she has been exercising so regularly.  Discussed that phentermine is not a good long-term medication to be on for weight loss, thus we will not restart this at this time.  Plan: -Start Wegovy 0.25 mg/week for a month, followed by 0.5 mg/week for a month

## 2022-08-22 NOTE — Assessment & Plan Note (Addendum)
-   Order placed for screening mammogram -Declined flu vaccine

## 2022-08-22 NOTE — Progress Notes (Signed)
CC: weight loss follow up  HPI:  Margaret Pacheco is a 56 y.o. female living with a history stated below and presents today for a 4 month evaluation and follow up for weight loss. Please see problem based assessment and plan for additional details.  Past Medical History:  Diagnosis Date   Allergy    Asthma    Essential hypertension     Current Outpatient Medications on File Prior to Visit  Medication Sig Dispense Refill   doxycycline (VIBRAMYCIN) 100 MG capsule Take 1 capsule (100 mg total) by mouth 2 (two) times daily. 14 capsule 0   fluticasone (FLONASE) 50 MCG/ACT nasal spray Place 2 sprays into both nostrils 2 (two) times daily. 15.8 mL 1   Insulin Pen Needle (PEN NEEDLES) 31G X 5 MM MISC 1 Units by Does not apply route daily. Use to inject  medication into the skin daily. DIAG CODE E66.01 100 each 1   nitrofurantoin, macrocrystal-monohydrate, (MACROBID) 100 MG capsule Take 1 capsule (100 mg total) by mouth 2 (two) times daily. 10 capsule 0   phentermine 15 MG capsule TAKE 1 CAPSULE(15 MG) BY MOUTH EVERY MORNING 30 capsule 1   topiramate (TOPAMAX) 50 MG tablet Take 1 tablet (50 mg total) by mouth daily. 30 tablet 2   No current facility-administered medications on file prior to visit.    Family History  Problem Relation Age of Onset   Heart disease Mother    Cancer Father     Social History   Socioeconomic History   Marital status: Soil scientist    Spouse name: Not on file   Number of children: Not on file   Years of education: Not on file   Highest education level: Not on file  Occupational History   Not on file  Tobacco Use   Smoking status: Never   Smokeless tobacco: Never  Vaping Use   Vaping Use: Never used  Substance and Sexual Activity   Alcohol use: No    Alcohol/week: 0.0 standard drinks of alcohol   Drug use: No   Sexual activity: Not on file  Other Topics Concern   Not on file  Social History Narrative   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[employer]      ___________________      Breakfast - usually no breakfast, banana or another fruit   Lunch - usually no lunch, crackers on weekends, nuts, dried fruit, Chex-Mix   Dinner - chicken or [place] baked with a vegetable and rice or potatoes, rarely red meats, salads   Snack - fruit or popcorn, yogurt   Drink - water, cranberry or orange juice, sometimes ginger-ale      Social Determinants of Health   Financial Resource Strain: Not on file  Food Insecurity: Not on file  Transportation Needs: Not on file  Physical Activity: Not on file  Stress: Not on file  Social Connections: Not on file  Intimate Partner Violence: Not on file    Review of Systems: ROS negative except for what is noted on the assessment and plan.  Vitals:   08/22/22 0854  BP: 130/83  Pulse: 66  Temp: 98.1 F (36.7 C)  TempSrc: Oral  SpO2: 100%  Weight: 186 lb 3.2 oz (84.5 kg)  Height: 5' 4.8" (1.646 m)    Physical Exam: Constitutional: well-appearing female sitting in chair, in no acute distress Cardiovascular: regular rate and rhythm, no m/r/g Pulmonary/Chest: normal work of breathing on room air, lungs clear to  auscultation bilaterally Abdominal: soft, non-tender, non-distended MSK: normal bulk and tone Neurological: alert & oriented x 3, no focal deficit Skin: warm and dry Psych: normal mood and behavior  Assessment & Plan:    Patient discussed with Dr. Evette Doffing  Obesity, morbid [facility]) The patient presents for a 55-monthfollow-up of obesity and weight loss.  She has tried multiple medications for weight loss including phentermine, topiramate, and Victoza previously the patient reports initial success with phentermine and topiramate, but has been off of these medications for over a month and states that she has been gaining the weight back.  Back in 2022, she lost about 20 pounds over an 831-montheriod while on these medications.  The  patient states she also previously had success with Victoza, but this was discontinued in 2022 and she is unsure why it was stopped.  At this time, the patient states that she would prefer to not have a daily injection and would prefer a once a week injection with WePFXTKW The patient states that she has been exercising 1 to 1-1/2 hours a day for about 5 days/week.  She runs and walks, and also uses the elliptical and stationary bike.  Her diet is relatively healthy, stating that she eats well red meats or foods that are high in saturated fat or cholesterol.  The patient is frustrated that she has gained weight since her last visit, but does state that she feels that some of her weight could be muscle weight since she has been exercising so regularly.  Discussed that phentermine is not a good long-term medication to be on for weight loss, thus we will not restart this at this time.  Plan: -Start Wegovy 0.25 mg/week for a month, followed by 0.5 mg/week for a month   Preventative health care - Order placed for screening mammogram -Declined flu vaccine   Rawson Minix, D.O. [facility], PGY-2 Phone: [phone] 08/22/2022 Time 9:17 AM

## 2022-08-22 NOTE — Patient Instructions (Signed)
Thank you, [name] for allowing [place] to provide your care today. Today we discussed:  Weight loss: I have sent in a prescription for Wegovy/Semaglutide (weekly injections). We will see if your insurance covers it, and if not, there may be a coupon card you can get to make it more affordable.  Continue to keep up with exercising regularly and your healthy diet!   I have ordered the following labs for you:  Lab Orders  No laboratory test(s) ordered today      Referrals ordered today:   Referral Orders  No referral(s) requested today     I have ordered the following medication/changed the following medications:   Stop the following medications: There are no discontinued medications.   Start the following medications: Meds ordered this encounter  Medications   Semaglutide-Weight Management (WEGOVY) 0.25 MG/0.5ML SOAJ    Sig: Inject 0.25 mg into the skin once a week for 30 days, THEN 0.5 mg once a week.    Dispense:  2 mL    Refill:  2     Follow up: 4-6 months    Should you have any questions or concerns please call the internal medicine clinic at [phone].     Buddy Duty, D.O. [place]

## 2022-08-25 ENCOUNTER — Telehealth: Payer: Self-pay | Admitting: *Deleted

## 2022-08-25 NOTE — Telephone Encounter (Signed)
Call from pt stating the pharmacy, [facility], and surrounding pharmacies do not have Wegovy. Suggested the doctor order a different medication. Thanks

## 2022-08-25 NOTE — Telephone Encounter (Signed)
Pt was informed of Dr Terrall Laity response and try again to call other pharmacies for availability.

## 2022-08-25 NOTE — Progress Notes (Signed)
Internal Medicine Clinic Attending ° °Case discussed with Dr. Atway  At the time of the visit.  We reviewed the resident’s history and exam and pertinent patient test results.  I agree with the assessment, diagnosis, and plan of care documented in the resident’s note.  °

## 2022-08-25 NOTE — Telephone Encounter (Signed)
Pt stated [facility] has [facility] but 1.7 mg.

## 2022-08-26 ENCOUNTER — Telehealth: Payer: Self-pay | Admitting: Student

## 2022-08-26 ENCOUNTER — Other Ambulatory Visit: Payer: Self-pay | Admitting: Student

## 2022-08-26 MED ORDER — TIRZEPATIDE 2.5 MG/0.5ML ~~LOC~~ SOAJ: SUBCUTANEOUS | 6 refills | Status: DC

## 2022-08-26 NOTE — Telephone Encounter (Signed)
Pt calling back to notify her PCP that she was told it is a Tree surgeon of the following medication (per the pharmacy) and that she needs to try a different instead.  Please call back.  Semaglutide-Weight Management (WEGOVY) 0.25 MG/0.5ML SOA   [id number] - [place], [place] [address]

## 2022-08-26 NOTE — Telephone Encounter (Signed)
Called pt - stated wegovy is not available state wide and she's willing to try a different medication for weight loss.

## 2022-08-26 NOTE — Progress Notes (Signed)
Patient called the clinic due to inability to fill her semaglutide [facility]) prescription.  There is a Tree surgeon and we discussed options other than the [facility].  She has tried liraglutide in the past and prefers the weekly injections.  We will send in tirzepatide to replace the semaglutide. - Tirzepatide 2.5 mg weekly for 4 weeks then increase to 5 mg weekly  Margaret Pacheco Internal medicine resident Internal medicine center

## 2022-08-26 NOTE — Telephone Encounter (Signed)
Called pt - no answer; left message on her vm of Dr Terrall Laity response;need to start at lower dose. And to call the office for any questions.

## 2022-08-28 ENCOUNTER — Telehealth: Payer: Self-pay | Admitting: *Deleted

## 2022-08-28 ENCOUNTER — Other Ambulatory Visit: Payer: Self-pay | Admitting: Student

## 2022-08-28 MED ORDER — SEMAGLUTIDE(0.25 OR 0.5MG/DOS) 2 MG/3ML ~~LOC~~ SOPN: PEN_INJECTOR | SUBCUTANEOUS | 11 refills | Status: DC

## 2022-08-28 NOTE — Progress Notes (Signed)
Patient called and stated that Ozempic will be covered by her insurance and that Darcel Bayley was not so the switch was made.  New Rx was sent to her pharmacy for [place].

## 2022-08-28 NOTE — Telephone Encounter (Signed)
Informed pt rx for Margaret Pacheco was sent to the pharmacy on 12/12 per Dr Nikki Dom. Stated she did not know this. I called [facility] who stated it's not covered by pt's insurance and will need a PA. I called the pt back who stated she had called and talked to her insurance company and she was told they can cover Ozempic up to 2.0 mg. She explained to them it's for weight loss; they stated no problem up to 2.0 mg. Send new rx  Thanks

## 2022-08-29 NOTE — Telephone Encounter (Signed)
Called pt to let her know of Ozempic rx - no answer; left message on vm.

## 2022-09-10 ENCOUNTER — Other Ambulatory Visit (HOSPITAL_BASED_OUTPATIENT_CLINIC_OR_DEPARTMENT_OTHER): Payer: Self-pay

## 2022-09-10 DIAGNOSIS — Z1231 Encounter for screening mammogram for malignant neoplasm of breast: Secondary | ICD-10-CM

## 2022-09-12 ENCOUNTER — Ambulatory Visit (HOSPITAL_BASED_OUTPATIENT_CLINIC_OR_DEPARTMENT_OTHER)
Admission: RE | Admit: 2022-09-12 | Discharge: 2022-09-12 | Disposition: A | Discharge status: Home / Self Care | Payer: BC Managed Care – PPO | Source: Ambulatory Visit | Attending: Diagnostic Radiology | Admitting: Diagnostic Radiology

## 2022-09-12 DIAGNOSIS — Z1231 Encounter for screening mammogram for malignant neoplasm of breast: Secondary | ICD-10-CM | POA: Diagnosis present

## 2022-09-24 ENCOUNTER — Telehealth: Payer: BC Managed Care – PPO | Admitting: Nurse Practitioner

## 2022-09-24 DIAGNOSIS — N3000 Acute cystitis without hematuria: Secondary | ICD-10-CM | POA: Diagnosis not present

## 2022-09-24 MED ORDER — SULFAMETHOXAZOLE-TRIMETHOPRIM 800-160 MG PO TABS: 1.0000 | ORAL_TABLET | Freq: Two times a day (BID) | ORAL | 0 refills | Status: AC

## 2022-09-24 NOTE — Progress Notes (Signed)
E-Visit for Urinary Problems  We are sorry that you are not feeling well.  Here is how we plan to help!  Based on what you shared with me it looks like you most likely have a simple urinary tract infection.  A UTI (Urinary Tract Infection) is a bacterial infection of the bladder.  Most cases of urinary tract infections are simple to treat but a key part of your care is to encourage you to drink plenty of fluids and watch your symptoms carefully.  I have prescribed Bactrim DS One tablet twice a day for 5 days.  Your symptoms should gradually improve. Call us if the burning in your urine worsens, you develop worsening fever, back pain or pelvic pain or if your symptoms do not resolve after completing the antibiotic.  If symptoms persist you need to be seen by your primary care physician for follow up. The Urine Culture you had in March did not show evidence of a UTI. If symptoms continue to recur you need to have follow up to assure your primary care physician is aware of your symptoms.   Urinary tract infections can be prevented by drinking plenty of water to keep your body hydrated.  Also be sure when you wipe, wipe from front to back and don't hold it in!  If possible, empty your bladder every 4 hours.  HOME CARE Drink plenty of fluids Compete the full course of the antibiotics even if the symptoms resolve Remember, when you need to go.go. Holding in your urine can increase the likelihood of getting a UTI! GET HELP RIGHT AWAY IF: You cannot urinate You get a high fever Worsening back pain occurs You see blood in your urine You feel sick to your stomach or throw up You feel like you are going to pass out  MAKE SURE YOU  Understand these instructions. Will watch your condition. Will get help right away if you are not doing well or get worse.   Thank you for choosing an e-visit.  Your e-visit answers were reviewed by a board certified advanced clinical practitioner to complete your  personal care plan. Depending upon the condition, your plan could have included both over the counter or prescription medications.  Please review your pharmacy choice. Make sure the pharmacy is open so you can pick up prescription now. If there is a problem, you may contact your provider through [employer] and have the prescription routed to another pharmacy.  Your safety is important to [place]. If you have drug allergies check your prescription carefully.   For the next 24 hours you can use MyChart to ask questions about today's visit, request a non-urgent call back, or ask for a work or school excuse. You will get an email in the next two days asking about your experience. I hope that your e-visit has been valuable and will speed your recovery.   Meds ordered this encounter  Medications   sulfamethoxazole-trimethoprim (BACTRIM DS) 800-160 MG tablet    Sig: Take 1 tablet by mouth 2 (two) times daily for 5 days.    Dispense:  10 tablet    Refill:  0    I spent approximately 5 minutes reviewing the patient's history, current symptoms and coordinating their care today.

## 2022-10-08 ENCOUNTER — Other Ambulatory Visit (HOSPITAL_COMMUNITY): Payer: Self-pay

## 2022-10-08 ENCOUNTER — Telehealth: Payer: Self-pay

## 2022-10-08 NOTE — Telephone Encounter (Signed)
A Prior Authorization was initiated for this patients OZEMPIC through CoverMyMeds.   Key: [id number]

## 2022-10-10 ENCOUNTER — Telehealth: Payer: Self-pay | Admitting: Genetic Counselor

## 2022-10-10 NOTE — Telephone Encounter (Addendum)
Prior Auth for patients medication OZEMPIC denied by [facility] via CoverMyMeds.   Reason: COVERED WHEN PATIENT HAS A DIAGNOSIS OF TYPE 2 DIABETES.  FULL EXPLANATION SCANNED TO MEDIA  CoverMyMeds Key: [id number]

## 2022-10-10 NOTE — Telephone Encounter (Signed)
Patient called to set up appt due to [place] pancreatic cancer.  Sister, Raiford Noble, was recently seen for pancreatic cancer and tested negative.  Other family members had pancreatic cancer with no GT.  Scheduled 3/21 at 8am.

## 2022-10-14 ENCOUNTER — Encounter: Payer: Self-pay | Admitting: Student

## 2022-10-20 ENCOUNTER — Telehealth: Payer: Self-pay

## 2022-10-20 NOTE — Telephone Encounter (Signed)
Decision:Denied Dear Margaret Pacheco: [facility]  received a request from your provider for coverage of Rybelsus (semaglutide). The request was denied because: Current plan approved criteria allows of this drug when the following criteria is met: -the patient has a diagnosis of type 2 diabetes mellitus Based on the policy and the information we have, your request is denied. The information provided to [place] indicates that you do not meet any of the criteria outlined above. You can ask for a free copy of the actual benefit provision, guideline, protocol or other similar criterion used to make the decision and any other information related to this decision by calling Customer Care at [phone]. For more information regarding your pharmacy benefit, please refer to your Benefit Booklet available on the Plan's website at [email or web address] You may request an appeal by sending a written request to the address below. To be eligible for an appeal, your request must be in writing and received within 180 days of the date of this letter. Please mail or fax your appeal to: [facility] / [address], [place] [address] Fax: [phone]

## 2022-10-20 NOTE — Telephone Encounter (Signed)
Prior Authorization for patient (Rybelsus) for patient came through on cover my meds was submitted with last office notes awaiting approval or denial

## 2022-11-17 ENCOUNTER — Encounter: Payer: Self-pay | Admitting: Podiatry

## 2022-12-01 ENCOUNTER — Ambulatory Visit (INDEPENDENT_AMBULATORY_CARE_PROVIDER_SITE_OTHER): Payer: BC Managed Care – PPO

## 2022-12-01 ENCOUNTER — Ambulatory Visit: Payer: BC Managed Care – PPO | Admitting: Podiatry

## 2022-12-01 DIAGNOSIS — M79672 Pain in left foot: Secondary | ICD-10-CM

## 2022-12-01 DIAGNOSIS — M79671 Pain in right foot: Secondary | ICD-10-CM | POA: Diagnosis not present

## 2022-12-01 DIAGNOSIS — M21622 Bunionette of left foot: Secondary | ICD-10-CM | POA: Diagnosis not present

## 2022-12-01 DIAGNOSIS — M21621 Bunionette of right foot: Secondary | ICD-10-CM

## 2022-12-01 NOTE — Progress Notes (Signed)
Chief Complaint  Patient presents with   Bunions    Patient came in today for bilateral tailor bunion,which started 6 months ago, patient rates her pain a 4 out of 10, Callus trim, X-rays done today     HPI: 57 y.o. female presenting today for evaluation of bilateral forefoot pain.  The patient states that she has significant pain and tenderness almost on a daily basis which has been exacerbated over the past 6 months.  Patient states that for the past several years she has been unable to wear certain shoes because they constrict her feet and cause significant pain.  She is also unable to wear any type of heels due to the pain.  Patient states she has several pairs of shoes that she has tried but they all seem to irritate her foot and cause symptoms.  Denies a history of injury.  She presents for further treatment and evaluation  Past Medical History:  Diagnosis Date   Allergy    Asthma    Essential hypertension     No past surgical history on file.  Allergies  Allergen Reactions   Norethin-Eth Estrad Triphasic     REACTION: pulmonary emboli   Penicillins      Physical Exam: General: The patient is alert and oriented x3 in no acute distress.  Dermatology: Skin is warm, dry and supple bilateral lower extremities. Negative for open lesions or macerations.  Hyperkeratotic calluses noted to the fifth MTP bilateral as well as the plantar aspect of the IPJ bilateral great toes.  These hyperkeratotic calluses are very tender with palpation  Vascular: Palpable pedal pulses bilaterally. Capillary refill within normal limits.  Negative for any significant edema or erythema  Neurological: Grossly intact via light touch  Musculoskeletal Exam: Tailor's bunion deformity noted bilateral with prominent fifth metatarsal heads and associated tenderness with palpation  Radiographic Exam B/L feet 12/01/2022:  Normal osseous mineralization. Joint spaces preserved. No fracture/dislocation/boney  destruction.  Prominent fifth metatarsal head noted bilateral.  There is also evidence of interphalangeal joint sesamoid noted to the bilateral great toes creating the symptomatic calluses to the plantar aspect of the IPJ bilateral.  Assessment: 1.  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 Patient evaluated. X-Rays reviewed.  2.  The patient is very frustrated with the pain and tenderness she has been experiencing on a daily basis.  She states that she has had issues with these areas for a few years now.  For now I would recommend that we pursue conservative treatment to see if we can get the patient feeling better without surgery 3.  Excisional debridement of the hyperkeratotic calluses was performed today using a 312 scalpel without incident or bleeding 4.  Appointment with orthotics department for custom molded orthotics to support the medial longitudinal arch of the foot and offload pressure from the fifth MTP and great toe joints bilateral 5.  Return to clinic in 3 months.  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 Husband is a patient of mine, Magazine features editor, [employer] driver with ankle DJD      Edrick Kins, DPM [facility]  Dr. Edrick Kins, DPM    [address]  [place], [place] [address]                Office [phone]  Fax [phone]

## 2022-12-04 ENCOUNTER — Inpatient Hospital Stay: Payer: BC Managed Care – PPO | Attending: *Deleted | Admitting: Genetic Counselor

## 2022-12-04 ENCOUNTER — Inpatient Hospital Stay: Payer: BC Managed Care – PPO

## 2022-12-04 DIAGNOSIS — Z803 Family history of malignant neoplasm of breast: Secondary | ICD-10-CM

## 2022-12-04 DIAGNOSIS — Z800 Family history of malignant neoplasm of digestive organs: Secondary | ICD-10-CM | POA: Diagnosis not present

## 2022-12-05 LAB — GENETIC SCREENING ORDER

## 2022-12-09 ENCOUNTER — Encounter: Payer: Self-pay | Admitting: Genetic Counselor

## 2022-12-09 DIAGNOSIS — Z800 Family history of malignant neoplasm of digestive organs: Secondary | ICD-10-CM

## 2022-12-09 DIAGNOSIS — Z803 Family history of malignant neoplasm of breast: Secondary | ICD-10-CM | POA: Insufficient documentation

## 2022-12-09 HISTORY — DX: Family history of malignant neoplasm of digestive organs: Z80.0

## 2022-12-09 HISTORY — DX: Family history of malignant neoplasm of breast: Z80.3

## 2022-12-09 NOTE — Progress Notes (Signed)
REFERRING PROVIDER: Self-referred  PRIMARY PROVIDER:  Serita Butcher, MD  PRIMARY REASON FOR VISIT:  1. Family history of pancreatic cancer   2. Family history of breast cancer      HISTORY OF PRESENT ILLNESS:   Ms. Rozell, a 57 y.o. female, was seen for a [place] cancer genetics consultation due to a family history of pancreatic and other cancers.  Ms. Hauenstein presents to clinic today to discuss the possibility of a hereditary predisposition to cancer, to discuss genetic testing, and to further clarify her future cancer risks, as well as potential cancer risks for family members.   Ms. Drewek is a 57 y.o. female with no personal history of cancer.    CANCER HISTORY:  Oncology History   No history exists.     Past Medical History:  Diagnosis Date   Allergy    Asthma    Essential hypertension      FAMILY HISTORY:  We obtained a detailed, 4-generation family history.  Significant diagnoses are listed below: Family History  Problem Relation Age of Onset   Lung cancer Father        dx 57s-80s   Pancreatic cancer Sister 41   Other Sister 56       astrocytoma   Pancreatic cancer Maternal Aunt        dx 32s   Breast cancer Maternal Aunt        d. 32   Prostate cancer Maternal Uncle    Pancreatic cancer Maternal Grandfather        dx 76s   Cancer Cousin        mat cousins; breast (female in 86s); gastric (female in 77s); pancreatic (female in 3s)    Ms. Tolson's sister with a history of pancreatic cancer recently had negative genetic testing through [place] +RNAinsight Panel.  Ms. Tortorello unaffected sister had negative [facility] MyRisk testing in 2020.    Ms. Leiva is unaware of other previous family history of genetic testing for hereditary cancer risks. Other relatives are unavailable for genetic testing at this time. There is no reported Ashkenazi Jewish ancestry. There is no known consanguinity.  GENETIC COUNSELING ASSESSMENT: Ms. Fingerhut is a 57 y.o.  female with a family history of pancreatic and other cancers which is somewhat suggestive of a hereditary cancer syndrome.  We, therefore, discussed and recommended the following at today's visit.   DISCUSSION: We discussed that 5 - 10% of cancer is hereditary, with most cases of hereditary pancreatic cancer associated with mutations in BRCA1/2.  There are other genes that can be associated with hereditary pancreatic cancer syndromes.  We discussed that testing is beneficial for several reasons, including knowing about other cancer risks, identifying potential screening and risk-reduction options that may be appropriate, and to understanding if other family members could be at risk for cancer and allowing them to undergo genetic testing.  We reviewed the characteristics, features and inheritance patterns of hereditary cancer syndromes. We also discussed genetic testing, including the appropriate family members to test, the process of testing, insurance coverage and turn-around-time for results. We discussed the implications of a negative, positive, and variant of uncertain significant result. We discussed that her sisters' negative genetic testing decrease, but do not eliminate, the chances of a positive genetic test result given the extensive breast/pancreatic/prostate cancers in other maternal relatives.  We discussed that negative results would be uninformative given that Ms. Guedes does not have a personal history of cancer. We recommended Ms. Scholes pursue genetic testing  for a panel that contains genes associated with breast, pancreatic, and other cancers.  The CancerNext-Expanded gene panel offered by [facility] and includes sequencing, rearrangement, and RNA analysis for the following 71 genes:  AIP, ALK, APC, ATM, BAP1, BARD1, BMPR1A, BRCA1, BRCA2, BRIP1, CDC73, CDH1, CDK4, CDKN1B, CDKN2A, CHEK2, DICER1, FH, FLCN, KIF1B, LZTR1,MAX, MEN1, MET, MLH1, MSH2, MSH6, MUTYH, NF1, NF2, NTHL1, PALB2, PHOX2B,  PMS2, POT1, PRKAR1A, PTCH1, PTEN, RAD51C,RAD51D, RB1, RET, SDHA, SDHAF2, SDHB, SDHC, SDHD, SMAD4, SMARCA4, SMARCB1, SMARCE1, STK11, SUFU, TMEM127, TP53,TSC1, TSC2 and VHL (sequencing and deletion/duplication); AXIN2, CTNNA1, EGFR, EGLN1, HOXB13, KIT, MITF, MSH3, PDGFRA, POLD1 and POLE (sequencing only); EPCAM and GREM1 (deletion/duplication only).   Based on Ms. Montante's personal history of cancer, she meets medical criteria for genetic testing. Despite that she meets criteria, she may still have an out of pocket cost. We discussed that if her out of pocket cost for testing is over $100, the laboratory should contact them to discuss self-pay options and/or patient pay assistance programs.   We discussed the Genetic Information Non-Discrimination Act (GINA) of 2008, which helps protect individuals against genetic discrimination based on their genetic test results.  It impacts both health insurance and employment.  With health insurance, it protects against genetic test results being used for increased premiums or policy termination. For employment, it protects against hiring, firing and promoting decisions based on genetic test results.  GINA does not apply to those in the [employer], those who work for companies with less than 15 employees, and new life insurance or long-term disability insurance policies.  Health status due to a cancer diagnosis is not protected under GINA.  Ms. Norway is a candidate for considering pancreatic cancer screening based on family history alone.  We briefly reviewed this with Ms. Maestre and agreed to discuss further upon return of genetics results.   PLAN: After considering the risks, benefits, and limitations, Ms. Mcfadyen provided informed consent to pursue genetic testing and the blood sample was sent to [facility] for analysis of the CancerNext-Expanded +RNAinsight Panel. Results should be available within approximately 3 weeks' time, at which point they will be disclosed by  telephone to Ms. Lasky, as will any additional recommendations warranted by these results. Ms. Cumbo will receive a summary of her genetic counseling visit and a copy of her results once available. This information will also be available in Epic.   Ms. Fang questions were answered to her satisfaction today. Our contact information was provided should additional questions or concerns arise.   Dan Dissinger M. Joette Catching, [place], [facility] Genetic Counselor [email or web address] (P) [phone]   The patient was seen for a total of 15 minutes in face-to-face genetic counseling.  The patient was seen alone.  Drs. Lindi Adie and/or Burr Medico were available to discuss this case as needed.  _______________________________________________________________________ For Office Staff:  Number of people involved in session: 1 Was an Intern/ student involved with case: no

## 2022-12-16 ENCOUNTER — Ambulatory Visit (INDEPENDENT_AMBULATORY_CARE_PROVIDER_SITE_OTHER): Payer: BC Managed Care – PPO

## 2022-12-16 DIAGNOSIS — M21622 Bunionette of left foot: Secondary | ICD-10-CM | POA: Diagnosis not present

## 2022-12-16 DIAGNOSIS — M21621 Bunionette of right foot: Secondary | ICD-10-CM | POA: Diagnosis not present

## 2022-12-16 NOTE — Progress Notes (Signed)
Patient presents today to be casted for custom molded orthotics. EVANS is the treating physician.  Impression foam cast was taken. ABN signed.  Patient info-  Shoe size: 8.5  Shoe style: DRESS  Height: 5FT 4IN  Weight: 176  Insurance: [place]   Patient will be notified once orthotics arrive in office and reappoint for fitting at that time.

## 2022-12-17 ENCOUNTER — Other Ambulatory Visit: Payer: Self-pay

## 2022-12-17 ENCOUNTER — Ambulatory Visit: Payer: BC Managed Care – PPO | Admitting: Internal Medicine

## 2022-12-17 ENCOUNTER — Encounter: Payer: Self-pay | Admitting: Internal Medicine

## 2022-12-17 DIAGNOSIS — Z6832 Body mass index (BMI) 32.0-32.9, adult: Secondary | ICD-10-CM

## 2022-12-17 MED ORDER — SAXENDA 18 MG/3ML ~~LOC~~ SOPN: PEN_INJECTOR | SUBCUTANEOUS | 0 refills | Status: DC

## 2022-12-17 NOTE — Assessment & Plan Note (Addendum)
The patient presents for 110-month follow-up of obesity and weight loss.  She has tried multiple medications for weight loss including phentermine, topiramate, and Victoza previously with initial success with phentermine and topiramate, but has been off of these medications for a few months, as those are not long-term medications.  At the last office visit, we prescribed Wegovy, but this was not covered by her insurance.  We then tried to prescribe Mounjaro, but this was also not covered by her insurance and is very cost prohibitive.  The patient has been exercising 1 to 2 hours a day for 4 to 5 days/week.  She runs and walks, and uses the elliptical at the gym.  Her diet is relatively healthy, and that she eats a lot of chicken and vegetables, with very little red meat or pork products.  She is very frustrated that she continues to gain weight, and is interested in starting a different medication for weight loss.  Of note, her weight was 170 pounds last March, 180 pounds last August, and 192.1 pounds today.  Plan: - Continued to counsel patient on lifestyle modifications - Patient is not interested in a referral to a dietitian/nutritionist (has tried this previously) - Restart Saxenda: 0.6 mg/day for 1 week, then 1.2 mg/day for 1 week, then 1.8 mg/day for 1 week, then 2.4 mg/day for 1 week, then 3 mg/day - Follow-up in 4 weeks to assess response

## 2022-12-17 NOTE — Progress Notes (Signed)
CC: weight loss  HPI:  Margaret Pacheco is a 57 y.o. female living with a history stated below and presents today for weight loss follow up. Please see problem based assessment and plan for additional details.  Past Medical History:  Diagnosis Date   Allergy    Asthma    Essential hypertension     Current Outpatient Medications on File Prior to Visit  Medication Sig Dispense Refill   fluticasone (FLONASE) 50 MCG/ACT nasal spray Place 2 sprays into both nostrils 2 (two) times daily. 15.8 mL 1   Insulin Pen Needle (PEN NEEDLES) 31G X 5 MM MISC 1 Units by Does not apply route daily. Use to inject  medication into the skin daily. DIAG CODE E66.01 100 each 1   No current facility-administered medications on file prior to visit.    Family History  Problem Relation Age of Onset   Heart disease Mother    Lung cancer Father        dx 63s-80s   Pancreatic cancer Sister 69   Other Sister 51       astrocytoma   Pancreatic cancer Maternal Aunt        dx 78s   Breast cancer Maternal Aunt        d. 34   Prostate cancer Maternal Uncle    Pancreatic cancer Maternal Grandfather        dx 73s   Cancer Cousin        mat cousins; breast (female in 66s); gastric (female in 2s); pancreatic (female in 74s)    Social History   Socioeconomic History   Marital status: Soil scientist    Spouse name: Not on file   Number of children: Not on file   Years of education: Not on file   Highest education level: Not on file  Occupational History   Not on file  Tobacco Use   Smoking status: Never   Smokeless tobacco: Never  Vaping Use   Vaping Use: Never used  Substance and Sexual Activity   Alcohol use: No    Alcohol/week: 0.0 standard drinks of alcohol   Drug use: No   Sexual activity: Not on file  Other Topics Concern   Not on file  Social History Narrative   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[employer]      ___________________      Breakfast  - usually no breakfast, banana or another fruit   Lunch - usually no lunch, crackers on weekends, nuts, dried fruit, Chex-Mix   Dinner - chicken or [place] baked with a vegetable and rice or potatoes, rarely red meats, salads   Snack - fruit or popcorn, yogurt   Drink - water, cranberry or orange juice, sometimes ginger-ale      Social Determinants of Health   Financial Resource Strain: Not on file  Food Insecurity: No Food Insecurity (12/17/2022)   Hunger Vital Sign    Worried About Running Out of Food in the Last Year: Never true    Ran Out of Food in the Last Year: Never true  Transportation Needs: No Transportation Needs (12/17/2022)   PRAPARE - Hydrologist (Medical): No    Lack of Transportation (Non-Medical): No  Physical Activity: Not on file  Stress: Not on file  Social Connections: Moderately Integrated (12/17/2022)   Social Connection and Isolation Panel [NHANES]    Frequency of Communication with Friends and Family: More than three times a week  Frequency of Social Gatherings with Friends and Family: More than three times a week    Attends Religious Services: More than 4 times per year    Active Member of [employer] or Organizations: No    Attends Archivist Meetings: Never    Marital Status: Living with partner  Intimate Partner Violence: Not At Risk (12/17/2022)   Humiliation, Afraid, Rape, and Kick questionnaire    Fear of Current or Ex-Partner: No    Emotionally Abused: No    Physically Abused: No    Sexually Abused: No    Review of Systems: ROS negative except for what is noted on the assessment and plan.  Vitals:   12/17/22 0906 12/17/22 0918  BP: (!) 146/79 (!) 153/87  Pulse: (!) 59 (!) 55  Temp: 97.9 F (36.6 C)   TempSrc: Oral   SpO2: 100%   Weight: 192 lb 1.6 oz (87.1 kg)   Height: 5\' 4"  (1.626 m)     Physical Exam: Constitutional: well-appearing female sitting in chair, in no acute distress Pulmonary/Chest: normal  work of breathing on room air Abdominal: soft, non-tender, non-distended Neurological: alert & oriented x 3, no focal deficit Skin: warm and dry Psych: normal mood and behavior  Assessment & Plan:   Patient discussed with Dr. Saverio Danker  Obesity, morbid [facility]) The patient presents for 85-month follow-up of obesity and weight loss.  She has tried multiple medications for weight loss including phentermine, topiramate, and Victoza previously with initial success with phentermine and topiramate, but has been off of these medications for a few months, as those are not long-term medications.  At the last office visit, we prescribed Wegovy, but this was not covered by her insurance.  We then tried to prescribe Mounjaro, but this was also not covered by her insurance and is very cost prohibitive.  The patient has been exercising 1 to 2 hours a day for 4 to 5 days/week.  She runs and walks, and uses the elliptical at the gym.  Her diet is relatively healthy, and that she eats a lot of chicken and vegetables, with very little red meat or pork products.  She is very frustrated that she continues to gain weight, and is interested in starting a different medication for weight loss.  Of note, her weight was 170 pounds last March, 180 pounds last August, and 192.1 pounds today.  Plan: - Continued to counsel patient on lifestyle modifications - Patient is not interested in a referral to a dietitian/nutritionist (has tried this previously) - Restart Saxenda: 0.6 mg/day for 1 week, then 1.2 mg/day for 1 week, then 1.8 mg/day for 1 week, then 2.4 mg/day for 1 week, then 3 mg/day - Follow-up in 4 weeks to assess response   Lorena Clearman, D.O. [facility], PGY-2 Phone: [phone] Date 12/17/2022 Time 9:23 AM

## 2022-12-17 NOTE — Patient Instructions (Signed)
Thank you, Margaret Pacheco for allowing [place] to provide your care today. Today we discussed:  Weight loss Start Saxenda: 0.6 mg/day for 1 week, then 0.2 mg/day for 1 week, then 1.8 mg/day for 1 week, then 2.4 mg/day for 1 week, followed by 3 mg/day Follow-up in 4 weeks to assess progress Keep up the great work with exercising 4 to 5 days a week and your healthy diet  I have ordered the following labs for you:  Lab Orders  No laboratory test(s) ordered today      Referrals ordered today:   Referral Orders  No referral(s) requested today     I have ordered the following medication/changed the following medications:   Stop the following medications: Medications Discontinued During This Encounter  Medication Reason   phentermine 15 MG capsule    Semaglutide,0.25 or 0.5MG /DOS, 2 MG/3ML SOPN    tirzepatide (MOUNJARO) 2.5 MG/0.5ML Pen    topiramate (TOPAMAX) 50 MG tablet      Start the following medications: Meds ordered this encounter  Medications   Liraglutide -Weight Management (SAXENDA) 18 MG/3ML SOPN    Sig: Inject 0.6 mg into the skin daily for 7 days, THEN 1.2 mg daily for 7 days, THEN 1.8 mg daily for 7 days, THEN 2.4 mg daily for 7 days, THEN 3 mg daily for 2 days.    Dispense:  8 mL    Refill:  0     Follow up:  4 weeks     Should you have any questions or concerns please call the internal medicine clinic at [phone].     Buddy Duty, D.O. [place]

## 2022-12-19 NOTE — Progress Notes (Signed)
Internal Medicine Clinic Attending ° °Case discussed with Dr. Atway  At the time of the visit.  We reviewed the resident’s history and exam and pertinent patient test results.  I agree with the assessment, diagnosis, and plan of care documented in the resident’s note.  °

## 2022-12-23 ENCOUNTER — Telehealth: Payer: Self-pay | Admitting: *Deleted

## 2022-12-23 NOTE — Telephone Encounter (Signed)
Pt states her insurance now will cover Mounjara. Send new rx. Also requesting genetic testing results. Thanks

## 2022-12-25 ENCOUNTER — Other Ambulatory Visit: Payer: Self-pay | Admitting: Student

## 2022-12-25 MED ORDER — TIRZEPATIDE 2.5 MG/0.5ML ~~LOC~~ SOAJ: 2.5000 mg | SUBCUTANEOUS | 2 refills | Status: AC

## 2022-12-26 NOTE — Telephone Encounter (Signed)
Called pt - no answer; left message Margaret Pacheco has been refilled and to schedule an appt in one month to assess her tolerance and possible increase per Dr Clearance Coots.

## 2022-12-29 ENCOUNTER — Telehealth: Payer: Self-pay

## 2022-12-29 NOTE — Telephone Encounter (Signed)
Prior Authorization for patient (Mounjaro) came through on cover my meds was submitted with last office notes awaiting approval or denial 

## 2022-12-29 NOTE — Telephone Encounter (Signed)
Decision:Denied Date: 12/29/2022 Crissie Sickles [address] [place], [place] [address] Plan Member Name: ANASTASYA WHITEHILL Plan Member ID: [id number] Plan Name: [facility] Plan [phone] Non-Grandfathered Prescriber Name: Crissie Sickles Prescriber Phone: [phone] Prescriber Fax: [phone] Dear Selena Batten Gamm: [facility]  received a request from your provider for coverage of Mounjaro (tirzepatide). The request was denied because: Your plan only covers this drug when it is used for certain health conditions. Covered use is for type 2 diabetes. Your plan does not cover this drug for your health condition that your doctor told us you have. We reviewed the information we had. Your request has been denied. Your doctor can send [place] any new or missing information for [place] to review. For this drug, you may have to meet other criteria. You can request the drug policy for more details. You can also request other plan documents for your review.

## 2023-01-05 ENCOUNTER — Encounter: Payer: Self-pay | Admitting: Genetic Counselor

## 2023-01-05 ENCOUNTER — Ambulatory Visit: Payer: Self-pay | Admitting: Genetic Counselor

## 2023-01-05 ENCOUNTER — Telehealth: Payer: Self-pay | Admitting: Genetic Counselor

## 2023-01-05 DIAGNOSIS — Z1379 Encounter for other screening for genetic and chromosomal anomalies: Secondary | ICD-10-CM

## 2023-01-05 DIAGNOSIS — Z803 Family history of malignant neoplasm of breast: Secondary | ICD-10-CM

## 2023-01-05 DIAGNOSIS — Z800 Family history of malignant neoplasm of digestive organs: Secondary | ICD-10-CM

## 2023-01-05 NOTE — Progress Notes (Signed)
HPI:   [name] was previously seen in the [facility] due to a family history of pancreatic cancer and concerns regarding a hereditary predisposition to cancer. Please refer to our prior cancer genetics clinic note for more information regarding our discussion, assessment and recommendations, at the time. Margaret Pacheco recent genetic test results were disclosed to her, as were recommendations warranted by these results. These results and recommendations are discussed in more detail below.  CANCER HISTORY:  Margaret Pacheco is a 57 y.o. female with no personal history of cancer.    FAMILY HISTORY:  We obtained a detailed, 4-generation family history.  Significant diagnoses are listed below:      Family History  Problem Relation Age of Onset   Lung cancer Father          dx 48s-80s   Pancreatic cancer Sister 48   Other Sister 56        astrocytoma   Pancreatic cancer Maternal Aunt          dx 45s   Breast cancer Maternal Aunt          d. 23   Prostate cancer Maternal Uncle     Pancreatic cancer Maternal Grandfather          dx 48s   Cancer Cousin          mat cousins; breast (female in 81s); gastric (female in 25s); pancreatic (female in 25s)     Margaret Pacheco's sister with a history of pancreatic cancer recently had negative genetic testing through [facility] CancerNext-Expanded +RNAinsight Panel.  Margaret Pacheco unaffected sister had negative [facility] MyRisk testing in 2020.     Margaret Pacheco is unaware of other previous family history of genetic testing for hereditary cancer risks. Other relatives are unavailable for genetic testing at this time. There is no reported Ashkenazi Jewish ancestry. There is no known consanguinity.  GENETIC TEST RESULTS:  The [facility] CancerNext-Expanded +RNA Panel found no pathogenic mutations.   The CancerNext-Expanded gene panel offered by [facility] and includes sequencing, rearrangement, and RNA analysis for the following 71 genes:  AIP, ALK, APC, ATM,  BAP1, BARD1, BMPR1A, BRCA1, BRCA2, BRIP1, CDC73, CDH1, CDK4, CDKN1B, CDKN2A, CHEK2, DICER1, FH, FLCN, KIF1B, LZTR1,MAX, MEN1, MET, MLH1, MSH2, MSH6, MUTYH, NF1, NF2, NTHL1, PALB2, PHOX2B, PMS2, POT1, PRKAR1A, PTCH1, PTEN, RAD51C,RAD51D, RB1, RET, SDHA, SDHAF2, SDHB, SDHC, SDHD, SMAD4, SMARCA4, SMARCB1, SMARCE1, STK11, SUFU, TMEM127, TP53,TSC1, TSC2 and VHL (sequencing and deletion/duplication); AXIN2, CTNNA1, EGFR, EGLN1, HOXB13, KIT, MITF, MSH3, PDGFRA, POLD1 and POLE (sequencing only); EPCAM and GREM1 (deletion/duplication only).   The test report has been scanned into EPIC and is located under the Molecular Pathology section of the Results Review tab.  A portion of the result report is included below for reference. Genetic testing reported out on December 31, 2022.     Even though a pathogenic variant was not identified, possible explanations for the cancer in the family may include: There may be no hereditary risk for cancer in the family. The cancers in Ms. Reeser's family may be familial or due to other genetic and environmental factors. There may be a gene mutation in one of these genes that current testing methods cannot detect but that chance is small. There could be another gene that has not yet been discovered, or that we have not yet tested, that is responsible for the cancer diagnoses in the family.  It is also possible there is a hereditary cause for the cancer in the  family that Margaret Pacheco did not inherit.   Therefore, it is important to remain in touch with cancer genetics in the future so that we can continue to offer Margaret Pacheco the most up to date genetic testing.     ADDITIONAL GENETIC TESTING:  We discussed with Margaret Pacheco that her genetic testing was fairly extensive.  If there are additional relevant genes identified to increase cancer risk that can be analyzed in the future, we would be happy to discuss and coordinate this testing at that time.     CANCER SCREENING  RECOMMENDATIONS:  Margaret Pacheco test result is considered negative (normal).  This means that we have not identified a hereditary cause for her family history of cancer at this time.   An individual's cancer risk and medical management are not determined by genetic test results alone. Overall cancer risk assessment incorporates additional factors, including personal medical history, family history, and any available genetic information that may result in a personalized plan for cancer prevention and surveillance. Therefore, it is recommended she continue to follow the cancer management and screening guidelines provided by her gastroenterology and primary healthcare provider.  Based on the reported personal and family history, specific cancer screenings for Margaret Pacheco include:  Consideration for pancreatic cancer screening. Margaret Pacheco has a family history of pancreatic cancer in one first degree relative (sister at age 12), two second degree relatives (MGM in her 65s, mat aunt in her 58s), and one third degree relative (mat cousin in his 90s). Per the International Cancer of the Pancreas Screening (CAPS) Consortium, individuals with a family history of pancreatic cancer in at least one first degree relative and one second degree relative should consider annual pancreatic cancer screening.  We therefore recommended that Margaret Pacheco consider pancreatic cancer screening. She agrees to referral. We will request that Margaret Pacheco be seen by Dr. Barron Alvine at [facility] for follow up and discussion of appropriate screening options given her family history of pancreatic cancer.    RECOMMENDATIONS FOR FAMILY MEMBERS:   Since she did not inherit a identifiable mutation in a cancer predisposition gene included on this panel, her children could not have inherited a known mutation from her in one of these genes. Individuals in this family might be at some increased risk of developing cancer, over the general  population risk, due to the family history of cancer.  Individuals in the family should notify their providers of the family history of cancer. We recommend women in this family have a yearly mammogram beginning at age 64, or 10 years younger than the earliest onset of cancer, an annual clinical breast exam, and perform monthly breast self-exams.  Relatives should consider pancreatic cancer screening as appropriate.    FOLLOW-UP:  Lastly, we discussed with Margaret Pacheco that cancer genetics is a rapidly advancing field and it is possible that new genetic tests will be appropriate for her and/or her family members in the future. We encouraged her to remain in contact with cancer genetics on an annual basis so we can update her personal and family histories and let her know of advances in cancer genetics that may benefit this family.   Our contact number was provided. Margaret Pacheco questions were answered to her satisfaction, and she knows she is welcome to call us at anytime with additional questions or concerns.   Yaresly Menzel M. Rennie Plowman, MS, [facility] Genetic Counselor [email or web address] (P) [phone]

## 2023-01-05 NOTE — Telephone Encounter (Signed)
Revealed negative genetics. Recommended referral to Dr. Carloyn Manner at [facility] for consideration of pancreatic cancer screening.          Patient had questions about billing/BCBS denying claim.  Sent message to [facility] requesting they call patient to answer questions.

## 2023-01-06 ENCOUNTER — Telehealth: Payer: Self-pay | Admitting: Podiatry

## 2023-01-06 NOTE — Telephone Encounter (Signed)
Lmom for pt to call back to schedule picking up orthotics,   Balance pending insurance

## 2023-02-17 ENCOUNTER — Encounter: Payer: Self-pay | Admitting: *Deleted

## 2023-02-25 ENCOUNTER — Telehealth (INDEPENDENT_AMBULATORY_CARE_PROVIDER_SITE_OTHER): Payer: BC Managed Care – PPO

## 2023-02-25 MED ORDER — ORLISTAT 60 MG PO CAPS: 60.0000 mg | ORAL_CAPSULE | Freq: Three times a day (TID) | ORAL | 1 refills | Status: DC

## 2023-02-25 NOTE — Assessment & Plan Note (Addendum)
Patient has had good weight loss while on liraglutide, however, her insurance is not covering this medication and she cannot afford it anymore. She is requesting to go back on phentermine-topiramate. Although patient is not currently on medication for hypertension, she was hypertensive at her last clinic visit. She has lost 10 lbs since her last visit and therefore may have improved BP as well. Discussed coming into clinic for a repeat visit to recheck BP. If BP is well-controlled then, could consider restarting phentermine-topiramate for a short period of time. For now, patient is comfortable trying orlistat. Counseled on possibility of GI upset and diarrhea with this medication.   Plan: - Consider restarting phentermine-topiramate at next clinic visit if patient has normal BP - Start orlistat

## 2023-02-25 NOTE — Progress Notes (Signed)
  [facility] Internal Medicine Residency Telephone Encounter Continuity Care Appointment  HPI:  This telephone encounter was created for [name] on 02/25/2023 for the following purpose/cc obesity f/u.  [name] is a 57 y.o. female with past medical history of HTN and obesity that presents for obesity f/u.  Obesity was last discussed in clinic on 4/3. It was discussed that the patient had tried  phentermine, topiramate, and Victoza without improvement of her weight. Patient had been exercising 1-2 hours per day for 4-5 days per week. Her diet mainly consisted of chicken and vegetables. Patient did not want a referral to a dietician/nutritionist as she had tried this previously without improvement of her weight. Reginal Lutes and Greggory Keen were not covered by her insurance. Plan was to start Liraglutide 0.6 mg/day for 1 week, then 1.2 mg/day for 1 week, then 1.8 mg/day for 1 week, then 2.4 mg/day for 1 week, then 3 mg/day.   Patient reports that her weight is approximately 180-182 lbs. She has lost about 10 lbs in the last 2 months. She reports that she is currently on the 2.4 mg/day dose but is having trouble affording this medication. She is requesting to go back on phentermine-topiramate.     Past Medical History:  Past Medical History:  Diagnosis Date   Allergy    Asthma    Essential hypertension      ROS:  Per HPI.    Assessment / Plan / Recommendations:  Please see A&P under problem oriented charting for assessment of the patient's acute and chronic medical conditions.  As always, pt is advised that if symptoms worsen or new symptoms arise, they should go to an urgent care facility or to to ER for further evaluation.  Obesity, morbid (HCC) Patient has had good weight loss while on liraglutide, however, her insurance is not covering this medication and she cannot afford it anymore. She is requesting to go back on phentermine-topiramate. Although patient is not currently on  medication for hypertension, she was hypertensive at her last clinic visit. She has lost 10 lbs since her last visit and therefore may have improved BP as well. Discussed coming into clinic for a repeat visit to recheck BP. If BP is well-controlled then, could consider restarting phentermine-topiramate for a short period of time. For now, patient is comfortable trying orlistat. Counseled on possibility of GI upset and diarrhea with this medication.   Plan: - Consider restarting phentermine-topiramate at next clinic visit if patient has normal BP - Start orlistat     Consent and Medical Decision Making:  Patient discussed with Dr. Sol Blazing.  This is a telephone encounter between Margaret Pacheco and Margaret Pacheco on 02/25/2023 for obesity f/u. The visit was conducted with the patient located at home and Margaret Pacheco at [facility]. The patient's identity was confirmed using their DOB and current address. The patient has consented to being evaluated through a telephone encounter and understands the associated risks (an examination cannot be done and the patient may need to come in for an appointment) / benefits (allows the patient to remain at home, decreasing exposure to coronavirus). I personally spent 30 minutes on medical discussion.

## 2023-02-27 NOTE — Progress Notes (Signed)
Internal Medicine Clinic Attending  Case discussed with Dr. Mapp  At the time of the visit.  We reviewed the resident's history and exam and pertinent patient test results.  I agree with the assessment, diagnosis, and plan of care documented in the resident's note.  

## 2023-02-27 NOTE — Addendum Note (Signed)
Addended by: Dickie La on: 02/27/2023 10:40 AM   Modules accepted: Level of Service

## 2023-03-02 DIAGNOSIS — E669 Obesity, unspecified: Secondary | ICD-10-CM

## 2023-03-04 ENCOUNTER — Telehealth: Payer: Self-pay | Admitting: Podiatry

## 2023-03-04 NOTE — Telephone Encounter (Signed)
Lvm for pt to schedule appt for PUO 

## 2023-03-18 ENCOUNTER — Ambulatory Visit: Payer: BC Managed Care – PPO | Admitting: Student

## 2023-03-18 VITALS — BP 144/82 | HR 55 | Temp 98.1°F | Ht 64.0 in | Wt 192.8 lb

## 2023-03-18 DIAGNOSIS — E669 Obesity, unspecified: Secondary | ICD-10-CM | POA: Diagnosis not present

## 2023-03-18 DIAGNOSIS — Z6830 Body mass index (BMI) 30.0-30.9, adult: Secondary | ICD-10-CM

## 2023-03-18 DIAGNOSIS — Z0000 Encounter for general adult medical examination without abnormal findings: Secondary | ICD-10-CM

## 2023-03-18 DIAGNOSIS — E66811 Obesity, class 1: Secondary | ICD-10-CM

## 2023-03-18 DIAGNOSIS — I10 Essential (primary) hypertension: Secondary | ICD-10-CM

## 2023-03-18 MED ORDER — AMLODIPINE BESYLATE 5 MG PO TABS
5.0000 mg | ORAL_TABLET | Freq: Every day | ORAL | 11 refills | Status: DC
Start: 2023-03-18 — End: 2023-09-14

## 2023-03-18 MED ORDER — PHENTERMINE-TOPIRAMATE 3.75-23 MG PO CP24
1.0000 | ORAL_CAPSULE | ORAL | 0 refills | Status: DC
Start: 2023-03-18 — End: 2023-03-23

## 2023-03-18 NOTE — Assessment & Plan Note (Addendum)
Elevated BP in past visits, BP today 140/89, recheck 144/82. Discussed long term importance of BP control. Plan: -Begin Amlodipine 5mg  -Continue w/ weight loss  -Discussed taking blood pressure at home, if the patient notes high pressures on her new medications I have instructed her to stop taking the phentermine topiramate, and give [place] a call.

## 2023-03-18 NOTE — Assessment & Plan Note (Addendum)
Patient previously on liraglutide, insurance stopped covering.  Was switched over to orlistat.  Patient having some loose stools, but states this is not bothering her. BMI stable since last visit. Discussed diet and exercise with patient, including some tips to help optimize diet. Patient met with dietitian in the past, feels that she does not have a need to repeat meeting with dietitian.  Patient would like to explore starting phentermine topiramate again.  We discussed the patient's blood pressure, starting her on blood pressure medication, and restarting phentermine topiramate. Plan: -Continue Orlistat -Begin phentermine and topiramate -Follow-up 1 month to assess weight loss, blood pressure in the setting of phentermine topiramate use.

## 2023-03-18 NOTE — Progress Notes (Signed)
Subjective:  CC: Follow-up visit for blood pressure, weight loss.  HPI:  [name] is a 57 y.o. female with a past medical history stated below and presents today for follow-up visit. Please see problem based assessment and plan for additional details.  Past Medical History:  Diagnosis Date   Allergy    Asthma    Essential hypertension     Current Outpatient Medications on File Prior to Visit  Medication Sig Dispense Refill   fluticasone (FLONASE) 50 MCG/ACT nasal spray Place 2 sprays into both nostrils 2 (two) times daily. 15.8 mL 1   Insulin Pen Needle (PEN NEEDLES) 31G X 5 MM MISC 1 Units by Does not apply route daily. Use to inject  medication into the skin daily. DIAG CODE E66.01 100 each 1   orlistat (ALLI) 60 MG capsule Take 1 capsule (60 mg total) by mouth 3 (three) times daily with meals. 30 capsule 1   tirzepatide (MOUNJARO) 2.5 MG/0.5ML Pen Inject 2.5 mg into the skin once a week. 2 mL 2   No current facility-administered medications on file prior to visit.    Family History  Problem Relation Age of Onset   Heart disease Mother    Lung cancer Father        dx 42s-80s   Pancreatic cancer Sister 7   Other Sister 70       astrocytoma   Pancreatic cancer Maternal Aunt        dx 59s   Breast cancer Maternal Aunt        d. 95   Prostate cancer Maternal Uncle    Pancreatic cancer Maternal Grandfather        dx 51s   Cancer Cousin        mat cousins; breast (female in 44s); gastric (female in 36s); pancreatic (female in 40s)    Social History   Socioeconomic History   Marital status: Media planner    Spouse name: Not on file   Number of children: Not on file   Years of education: Not on file   Highest education level: Not on file  Occupational History   Not on file  Tobacco Use   Smoking status: Never   Smokeless tobacco: Never  Vaping Use   Vaping Use: Never used  Substance and Sexual Activity   Alcohol use: No    Alcohol/week: 0.0 standard  drinks of alcohol   Drug use: No   Sexual activity: Not on file  Other Topics Concern   Not on file  Social History Narrative   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[employer]      ___________________      Breakfast - usually no breakfast, banana or another fruit   Lunch - usually no lunch, crackers on weekends, nuts, dried fruit, Chex-Mix   Dinner - chicken or [place] baked with a vegetable and rice or potatoes, rarely red meats, salads   Snack - fruit or popcorn, yogurt   Drink - water, cranberry or orange juice, sometimes ginger-ale      Social Determinants of Health   Financial Resource Strain: Not on file  Food Insecurity: No Food Insecurity (12/17/2022)   Hunger Vital Sign    Worried About Running Out of Food in the Last Year: Never true    Ran Out of Food in the Last Year: Never true  Transportation Needs: No Transportation Needs (12/17/2022)   PRAPARE - Administrator, Civil Service (Medical): No  Lack of Transportation (Non-Medical): No  Physical Activity: Not on file  Stress: Not on file  Social Connections: Moderately Integrated (12/17/2022)   Social Connection and Isolation Panel [NHANES]    Frequency of Communication with Friends and Family: More than three times a week    Frequency of Social Gatherings with Friends and Family: More than three times a week    Attends Religious Services: More than 4 times per year    Active Member of [employer] or Organizations: No    Attends Banker Meetings: Never    Marital Status: Living with partner  Intimate Partner Violence: Not At Risk (12/17/2022)   Humiliation, Afraid, Rape, and Kick questionnaire    Fear of Current or Ex-Partner: No    Emotionally Abused: No    Physically Abused: No    Sexually Abused: No    Review of Systems: ROS negative except for what is noted on the assessment and plan.  Objective:   Vitals:   03/18/23 1015 03/18/23 1048  BP: (!) 140/89  (!) 144/82  Pulse: (!) 59 (!) 55  Temp: 98.1 F (36.7 C)   TempSrc: Oral   SpO2: 100%   Weight: 192 lb 12.8 oz (87.5 kg)   Height: 5\' 4"  (1.626 m)     Physical Exam: Constitutional: well-appearing, in no acute distress HENT: normocephalic atraumatic, mucous membranes moist Eyes: conjunctiva non-erythematous Neck: supple Cardiovascular: regular rate and rhythm, no m/r/g Pulmonary/Chest: normal work of breathing on room air, lungs clear to auscultation bilaterally Abdominal: soft, non-tender, non-distended MSK: normal bulk and tone    Assessment & Plan:  Obesity (BMI 30.0-34.9) Patient previously on liraglutide, insurance stopped covering.  Was switched over to orlistat.  Patient having some loose stools, but states this is not bothering her. BMI stable since last visit. Discussed diet and exercise with patient, including some tips to help optimize diet. Patient met with dietitian in the past, feels that she does not have a need to repeat meeting with dietitian.  Patient would like to explore starting phentermine topiramate again.  We discussed the patient's blood pressure, starting her on blood pressure medication, and restarting phentermine topiramate. Plan: -Continue Orlistat -Begin phentermine and topiramate -Follow-up 1 month to assess weight loss, blood pressure in the setting of phentermine topiramate use.   Preventative health care Pt. Never has had colonoscopy, up to date on pap, Mammo Plan: -GI referral for Colonoscopy  Essential hypertension Elevated BP in past visits, BP today 140/89, recheck 144/82. Discussed long term importance of BP control. Plan: -Begin Amlodipine 5mg  -Continue w/ weight loss  -Discussed taking blood pressure at home, if the patient notes high pressures on her new medications I have instructed her to stop taking the phentermine topiramate, and give [place] a call.   Patient seen with Dr. Heide Scales MD [facility] Internal  Medicine  PGY-1 Pager: [phone]  Phone: [phone] Date 03/18/2023  Time 11:02 AM

## 2023-03-18 NOTE — Patient Instructions (Addendum)
Thank you, [name] for allowing [place] to provide your care today. Today we discussed weight loss and blood pressure management, as well as preventative health measures.    I have ordered the following labs for you:  Lab Orders  No laboratory test(s) ordered today    Referrals ordered today:   Referral Orders         Ambulatory referral to Gastroenterology       I have ordered the following medication/changed the following medications:   Stop the following medications: There are no discontinued medications.   Start the following medications: Meds ordered this encounter  Medications   Phentermine-Topiramate 3.75-23 MG CP24    Sig: Take 1 capsule by mouth 1 day or 1 dose.    Dispense:  30 capsule    Refill:  0   amLODipine (NORVASC) 5 MG tablet    Sig: Take 1 tablet (5 mg total) by mouth daily.    Dispense:  30 tablet    Refill:  11     Follow up:  1 month    Remember: We will want to make sure your blood pressure is okay with the new medications you are starting.  We look forward to seeing you next time. Please call our clinic at [phone] if you have any questions or concerns. The best time to call is Monday-Friday from 9am-4pm, but there is someone available 24/7. If after hours or the weekend, call the main hospital number and ask for the Internal Medicine Resident On-Call. If you need medication refills, please notify your pharmacy one week in advance and they will send [place] a request.   Thank you for trusting me with your care. Wishing you the best!  Margaret Macadamia MD [facility]

## 2023-03-18 NOTE — Assessment & Plan Note (Addendum)
Pt. Never has had colonoscopy, up to date on pap, Mammo Plan: -GI referral for Colonoscopy

## 2023-03-21 ENCOUNTER — Encounter: Payer: Self-pay | Admitting: Podiatry

## 2023-03-23 ENCOUNTER — Other Ambulatory Visit: Payer: Self-pay

## 2023-03-23 ENCOUNTER — Telehealth: Payer: Self-pay

## 2023-03-23 DIAGNOSIS — E669 Obesity, unspecified: Secondary | ICD-10-CM

## 2023-03-23 MED ORDER — PHENTERMINE-TOPIRAMATE 3.75-23 MG PO CP24
1.00 | ORAL_CAPSULE | Freq: Every day | ORAL | 0 refills | Status: AC
Start: 2023-03-23 — End: 2023-04-22

## 2023-03-23 NOTE — Telephone Encounter (Signed)
Pa for pt ( PHENTERMINE- TOP 3.75-23 MG ) came through on cover my meds was submitted with last office notes and weights...    Awaiting approval or denial

## 2023-03-23 NOTE — Telephone Encounter (Addendum)
Denied :    Your plan only covers this drug when you experience benefits from taking the drug. We have denied your request because you did not have good outcomes from the drug. We reviewed the information we had. Your request has been denied. Your doctor can send [place] any new or missing information for [place] to review. For this drug, you may have to meet other criteria. You can request the drug policy for more details. You can also request other plan documents for your review.       ( This is based on the starting weight 7/52022  and last office visit weight 12/17/2022  )

## 2023-03-24 NOTE — Addendum Note (Signed)
Addended by: Earl Lagos on: 03/24/2023 11:43 AM   Modules accepted: Level of Service

## 2023-03-24 NOTE — Progress Notes (Signed)
Internal Medicine Clinic Attending  I was physically present during the key portions of the resident provided service and participated in the medical decision making of patient's management care. I reviewed pertinent patient test results.  The assessment, diagnosis, and plan were formulated together and I agree with the documentation in the resident's note.  Janson Lamar, MD  

## 2023-06-17 ENCOUNTER — Telehealth: Payer: BC Managed Care – PPO | Admitting: Student

## 2023-06-17 VITALS — Wt 185.0 lb

## 2023-06-17 DIAGNOSIS — E66811 Obesity, class 1: Secondary | ICD-10-CM

## 2023-06-17 DIAGNOSIS — Z0000 Encounter for general adult medical examination without abnormal findings: Secondary | ICD-10-CM | POA: Insufficient documentation

## 2023-06-17 DIAGNOSIS — R3589 Other polyuria: Secondary | ICD-10-CM

## 2023-06-17 DIAGNOSIS — E669 Obesity, unspecified: Secondary | ICD-10-CM

## 2023-06-17 DIAGNOSIS — Z6831 Body mass index (BMI) 31.0-31.9, adult: Secondary | ICD-10-CM | POA: Diagnosis not present

## 2023-06-17 DIAGNOSIS — I10 Essential (primary) hypertension: Secondary | ICD-10-CM | POA: Diagnosis not present

## 2023-06-17 HISTORY — DX: Other polyuria: R35.89

## 2023-06-17 MED ORDER — PHENTERMINE HCL 15 MG PO CAPS
15.0000 mg | ORAL_CAPSULE | ORAL | 2 refills | Status: DC
Start: 2023-06-17 — End: 2024-01-25

## 2023-06-17 NOTE — Assessment & Plan Note (Addendum)
Patient doing a bit better with her weight. Phentermine-Topiramate was not approved by her insurance, and she took this medication for only about two weeks. She has lost about 7lb since last visit. Not having menstrual cycles  Plan: Continue with lifestyle modifications -  Patient exercising regularly, avoiding liquid calories, and replacing carbs with proteins Will initiate Phentermine 15mg 

## 2023-06-17 NOTE — Assessment & Plan Note (Signed)
Started on amlodipine 5mg  last visit. Doing well, Home Bps in 120-130's systolic range. Notes work stress is rasing her BP.  Plan: Continue amlodipine 5mg   Will continue to monitor BP, and will let us know if BP increase with phentermine

## 2023-06-17 NOTE — Assessment & Plan Note (Signed)
Endorsing polyuria, polydipsia and some fatigue. Last A1c high end of normal about 4 years ago. Discussed that this may be signs of DM2. Has been going on since around March.  Plan: Placed future lab for POC A1C

## 2023-06-17 NOTE — Assessment & Plan Note (Addendum)
Due for colonoscopy, will put in referral. Mammo / Pap up to date.

## 2023-06-17 NOTE — Progress Notes (Signed)
   I connected with  Margaret Pacheco on 06/17/23 by telephone and verified that I am speaking with the correct person using two identifiers.   I discussed the limitations of evaluation and management by telemedicine. The patient expressed understanding and agreed to proceed.  CC: Follow up BP / weight  This is a telephone encounter between Margaret Pacheco and Margaret Pacheco on 06/17/2023 for follow up on her blood pressure and weight loss. The visit was conducted with the patient located at home and Margaret Pacheco at [facility]. The patient's identity was confirmed using their DOB and current address. The patient has consented to being evaluated through a telephone encounter and understands the associated risks (an examination cannot be done and the patient may need to come in for an appointment) / benefits (allows the patient to remain at home, decreasing exposure to coronavirus). I personally spent 10 minutes on medical discussion.   HPI:  [name] is a 57 y.o. with PMH as below.   Please see A&P for assessment of the patient's acute and chronic medical conditions.   Past Medical History:  Diagnosis Date   Allergy    Asthma    Essential hypertension    Review of Systems:  pertinent positives and negatives in A&P  Assessment & Plan:   Obesity (BMI 30.0-34.9) Patient doing a bit better with her weight. Phentermine-Topiramate was not approved by her insurance, and she took this medication for only about two weeks. She has lost about 7lb since last visit. Plan: Continue with lifestyle modifications -  Patient exercising regularly, avoiding liquid calories, and replacing carbs with proteins Will initiate Phentermine 15mg    Healthcare maintenance Due for colonoscopy, will put in referral. Mammo / Pap up to date.    Essential hypertension Started on amlodipine 5mg  last visit. Doing well, Home Bps in 120-130's systolic range. Notes work stress is rasing her BP.  Plan: Continue  amlodipine 5mg   Will continue to monitor BP, and will let us know if BP increase with phentermine    Polyuria Endorsing polyuria, polydipsia and some fatigue. Last A1c high end of normal about 4 years ago. Discussed that this may be signs of DM2. Has been going on since around March.  Plan: Placed future lab for POC A1C    Patient seen with Dr. Wille Celeste Masters, D.O. [facility] Internal [facility]  PGY-2 Pager: [phone]  Phone: [phone] Date 06/17/2023  Time 2:00 PM

## 2023-06-22 NOTE — Progress Notes (Signed)
Internal Medicine Clinic Attending  I was physically present during the key portions of the resident provided service and participated in the medical decision making of patient's management care. I reviewed pertinent patient test results.  The assessment, diagnosis, and plan were formulated together and I agree with the documentation in the resident's note.  Reymundo Poll, MD

## 2023-07-09 ENCOUNTER — Telehealth: Payer: Self-pay | Admitting: *Deleted

## 2023-07-09 DIAGNOSIS — Z800 Family history of malignant neoplasm of digestive organs: Secondary | ICD-10-CM

## 2023-07-09 NOTE — Telephone Encounter (Signed)
Pt is requesting a referral for cancer genetic testing (family hx colon and pancreatic cancer); to Dr Esperanza Sheets in [place].

## 2023-07-09 NOTE — Telephone Encounter (Signed)
Referral placed for genetic testing. Patient with history of pancreatic and colon cancer in multiple family members.

## 2023-08-24 NOTE — Telephone Encounter (Signed)
Patient called to cancel schedule [facility] appointments for 12/11. Patient states sh

## 2023-08-26 ENCOUNTER — Other Ambulatory Visit: Payer: BC Managed Care – PPO

## 2023-08-26 ENCOUNTER — Encounter: Payer: BC Managed Care – PPO | Admitting: Genetic Counselor

## 2023-08-27 ENCOUNTER — Telehealth: Payer: Self-pay | Admitting: Genetic Counselor

## 2023-09-07 ENCOUNTER — Inpatient Hospital Stay (HOSPITAL_BASED_OUTPATIENT_CLINIC_OR_DEPARTMENT_OTHER): Admission: RE | Admit: 2023-09-07 | Payer: BC Managed Care – PPO | Source: Ambulatory Visit | Admitting: Radiology

## 2023-09-07 DIAGNOSIS — Z1231 Encounter for screening mammogram for malignant neoplasm of breast: Secondary | ICD-10-CM

## 2023-09-07 NOTE — Telephone Encounter (Signed)
error 

## 2023-09-14 ENCOUNTER — Encounter: Payer: Self-pay | Admitting: Student

## 2023-09-14 ENCOUNTER — Ambulatory Visit: Payer: BC Managed Care – PPO | Admitting: Student

## 2023-09-14 VITALS — BP 117/70 | HR 85 | Temp 98.1°F | Ht 64.0 in | Wt 184.4 lb

## 2023-09-14 DIAGNOSIS — E669 Obesity, unspecified: Secondary | ICD-10-CM | POA: Diagnosis not present

## 2023-09-14 DIAGNOSIS — I10 Essential (primary) hypertension: Secondary | ICD-10-CM

## 2023-09-14 DIAGNOSIS — Z0000 Encounter for general adult medical examination without abnormal findings: Secondary | ICD-10-CM

## 2023-09-14 DIAGNOSIS — Z6831 Body mass index (BMI) 31.0-31.9, adult: Secondary | ICD-10-CM | POA: Diagnosis not present

## 2023-09-14 DIAGNOSIS — Z1211 Encounter for screening for malignant neoplasm of colon: Secondary | ICD-10-CM

## 2023-09-14 DIAGNOSIS — Z122 Encounter for screening for malignant neoplasm of respiratory organs: Secondary | ICD-10-CM

## 2023-09-14 DIAGNOSIS — E66811 Obesity, class 1: Secondary | ICD-10-CM

## 2023-09-14 MED ORDER — WEGOVY 1.7 MG/0.75ML ~~LOC~~ SOAJ: 1.7000 mg | SUBCUTANEOUS | 0 refills | Status: DC

## 2023-09-14 MED ORDER — AMLODIPINE BESYLATE 5 MG PO TABS: 5.0000 mg | ORAL_TABLET | Freq: Every day | ORAL | 11 refills | Status: AC

## 2023-09-14 NOTE — Patient Instructions (Addendum)
Thank you, Ms.Alenah N Riemann for allowing [place] to provide your care today. Today we discussed your general health. - I will refill your medications for you - We are sending you home with a stool test kit for colon cancer screening - Please get a stool sample on the card and return it to the lab.    I have ordered the following labs for you:  Lab Orders         Fecal occult blood, imunochemical         Hemoglobin A1c       Tests ordered today:    Referrals ordered today:   Referral Orders  No referral(s) requested today     I have ordered the following medication/changed the following medications:   Stop the following medications: Medications Discontinued During This Encounter  Medication Reason   amLODipine (NORVASC) 5 MG tablet Reorder     Start the following medications: Meds ordered this encounter  Medications   amLODipine (NORVASC) 5 MG tablet    Sig: Take 1 tablet (5 mg total) by mouth daily.    Dispense:  30 tablet    Refill:  11   Semaglutide-Weight Management (WEGOVY) 1.7 MG/0.75ML SOAJ    Sig: Inject 1.7 mg into the skin once a week.    Dispense:  3 mL    Refill:  0     Follow up: 4 months   Remember:   Should you have any questions or concerns please call the internal medicine clinic at [phone].    Kathleen Lime, M.D [facility]

## 2023-09-14 NOTE — Progress Notes (Signed)
CC: Routine check up   HPI:  Margaret Pacheco is a 57 y.o. female living with a history stated below and presents today for routine check up.Please see problem based assessment and plan for additional details.  Past Medical History:  Diagnosis Date   Allergy    Asthma    Essential hypertension     Current Outpatient Medications on File Prior to Visit  Medication Sig Dispense Refill   fluticasone (FLONASE) 50 MCG/ACT nasal spray Place 2 sprays into both nostrils 2 (two) times daily. 15.8 mL 1   orlistat (ALLI) 60 MG capsule Take 1 capsule (60 mg total) by mouth 3 (three) times daily with meals. 30 capsule 1   phentermine 15 MG capsule Take 1 capsule (15 mg total) by mouth every morning. 30 capsule 2   No current facility-administered medications on file prior to visit.    Family History  Problem Relation Age of Onset   Heart disease Mother    Lung cancer Father        dx 93s-80s   Pancreatic cancer Sister 80   Other Sister 23       astrocytoma   Pancreatic cancer Maternal Aunt        dx 10s   Breast cancer Maternal Aunt        d. 34   Prostate cancer Maternal Uncle    Pancreatic cancer Maternal Grandfather        dx 13s   Cancer Cousin        mat cousins; breast (female in 69s); gastric (female in 65s); pancreatic (female in 62s)    Social History   Socioeconomic History   Marital status: Media planner    Spouse name: Not on file   Number of children: Not on file   Years of education: Not on file   Highest education level: Not on file  Occupational History   Not on file  Tobacco Use   Smoking status: Never   Smokeless tobacco: Never  Vaping Use   Vaping status: Never Used  Substance and Sexual Activity   Alcohol use: No    Alcohol/week: 0.0 standard drinks of alcohol   Drug use: No   Sexual activity: Not on file  Other Topics Concern   Not on file  Social History Narrative   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[place] [employer]      ___________________      Breakfast - usually no breakfast, banana or another fruit   Lunch - usually no lunch, crackers on weekends, nuts, dried fruit, Chex-Mix   Dinner - chicken or [place] baked with a vegetable and rice or potatoes, rarely red meats, salads   Snack - fruit or popcorn, yogurt   Drink - water, cranberry or orange juice, sometimes ginger-ale      Social Drivers of Corporate investment banker Strain: Not on file  Food Insecurity: No Food Insecurity (12/17/2022)   Hunger Vital Sign    Worried About Running Out of Food in the Last Year: Never true    Ran Out of Food in the Last Year: Never true  Transportation Needs: No Transportation Needs (12/17/2022)   PRAPARE - Administrator, Civil Service (Medical): No    Lack of Transportation (Non-Medical): No  Physical Activity: Not on file  Stress: Not on file  Social Connections: Moderately Integrated (12/17/2022)   Social Connection and Isolation Panel [NHANES]    Frequency of Communication with Friends and  Family: More than three times a week    Frequency of Social Gatherings with Friends and Family: More than three times a week    Attends Religious Services: More than 4 times per year    Active Member of [employer] or Organizations: No    Attends Banker Meetings: Never    Marital Status: Living with partner  Intimate Partner Violence: Not At Risk (12/17/2022)   Humiliation, Afraid, Rape, and Kick questionnaire    Fear of Current or Ex-Partner: No    Emotionally Abused: No    Physically Abused: No    Sexually Abused: No    Review of Systems: ROS negative except for what is noted on the assessment and plan.  Vitals:   09/14/23 1554  BP: 117/70  Pulse: 85  Temp: 98.1 F (36.7 C)  TempSrc: Oral  SpO2: 100%  Weight: 184 lb 6.4 oz (83.6 kg)  Height: 5\' 4"  (1.626 m)    Physical Exam: Constitutional: obese appearing woman, in no acute distress  Cardiovascular: regular rate and  rhythm, no m/r/g Pulmonary/Chest: normal work of breathing on room air, lungs clear to auscultation bilaterally MSK: normal bulk and tone Skin: warm and dry Psych: normal mood and behavior  Assessment & Plan:   Essential hypertension BP at goal of 117/70 on amlodipine 5 mg. Continue to monitor BP since patient is on phentermine  - Continue same regimen  Obesity (BMI 30.0-34.9) BMI of 31.65 on phentermine. Weight down from 185 to 184 today. Patient is interested in trying wegovy at this time. Medication side effects and benefits  and chances of approval adequately discussed with patient. Will get a Hgb A1c today to assess her diabetes status.  - Counsel of life style medication - Continue phentermine - Prescribe Wegovy  - Continue to monitor BP   Preventative health care Patient is uptodate on her annual breast cancer screenings but hasn't gotten a colonoscopy done yet. Patient expressed her concerns for getting time fr the procedure due to her job. Other screening modalities discussed with patient and she agrees to do the FOBT at this time.  - FOBT testing    Patient seen with Dr. Heloise Beecham, M.D [facility] Internal Medicine Phone: [phone] Date 09/14/2023 Time 4:42 PM

## 2023-09-14 NOTE — Assessment & Plan Note (Signed)
BMI of 31.65 on phentermine. Weight down from 185 to 184 today. Patient is interested in trying wegovy at this time. Medication side effects and benefits  and chances of approval adequately discussed with patient. Will get a Hgb A1c today to assess her diabetes status.  - Counsel of life style medication - Continue phentermine - Prescribe Wegovy  - Continue to monitor BP

## 2023-09-14 NOTE — Assessment & Plan Note (Addendum)
BP at goal of 117/70 on amlodipine 5 mg. Continue to monitor BP since patient is on phentermine  - Continue same regimen

## 2023-09-14 NOTE — Assessment & Plan Note (Addendum)
Patient is uptodate on her annual breast cancer screenings but hasn't gotten a colonoscopy done yet. Patient expressed her concerns for getting time fr the procedure due to her job. Other screening modalities discussed with patient and she agrees to do the FOBT at this time.  - FOBT testing

## 2023-09-15 LAB — HEMOGLOBIN A1C
Est. average glucose Bld gHb Est-mCnc: 108 mg/dL
Hgb A1c MFr Bld: 5.4 % (ref 4.8–5.6)

## 2023-09-24 ENCOUNTER — Telehealth: Payer: Self-pay | Admitting: *Deleted

## 2023-09-24 NOTE — Telephone Encounter (Signed)
 Pt states her insurance co will not cover Wegovy  but her doctor could submit a letter of necessity d/t her hx of HBP and her past readings. Fax letter to  [phone]  Or pt found a place in [place], [employer], who makes compounded semaglutide . She wants to know if her doctor would approve of this; if so, send rx  with a note stating  ok for pt to get compounded medication. Pt states the cost will be $210 instead of $1263 per month.

## 2023-09-28 NOTE — Telephone Encounter (Signed)
 Pt called and informed of Dr Ronald Lobo response about wegovy and compound med.    "Unfortunately Wegovy and all similar medications will only be covered if patient has diabetes.  I would not advise compound pharmacy as means to mitigate costs.  Thanks "

## 2023-11-30 ENCOUNTER — Encounter: Payer: Self-pay | Admitting: Podiatry

## 2024-01-08 ENCOUNTER — Telehealth: Payer: Self-pay | Admitting: *Deleted

## 2024-01-08 ENCOUNTER — Telehealth: Payer: Self-pay

## 2024-01-08 NOTE — Telephone Encounter (Signed)
 Copied from CRM [id number]. Topic: Clinical - Medication Question >> Jan 08, 2024  9:39 AM Karole Pacer C wrote: Reason for CRM: Patient states her insurance is requesting a prior auth for (WEGOVY ) but she is asking if she could just go back on her phentermine  15 MG capsule instead. Patient's call back # is [phone]

## 2024-01-08 NOTE — Telephone Encounter (Addendum)
 Prior Authorization for patient (Wegovy  1.7MG /0.75ML) came through on cover my meds was submitted per cover my meds..    Patient has Aetna NF:[id number] Rx NGE:[id number] Rx Pcn: [id number] Rx Group: [id number]

## 2024-01-08 NOTE — Telephone Encounter (Signed)
 Prior Authorization has been submitted and does not require a PA (please see note from 01/08/24).

## 2024-01-08 NOTE — Telephone Encounter (Signed)
 Margaret Pacheco (Key: [id number]) Wegovy  1.7MG /0.75ML auto-injectors Form [employer] Electronic PA Form (2017 NCPDP) Created Sent to Plan Plan Response Submit Clinical Questions Determination Message from Plan Your PA has been resolved, no additional PA is required. For further inquiries please contact the number on the back of the member prescription card. (Message 1005)  Patient is aware

## 2024-01-13 NOTE — Telephone Encounter (Signed)
 I called pt  to inform her of Dr Sherrel Dodge response to Wegovy /Phentermine . She stated her insurance will not pay for Wegovy . She's willing to schedule an appt to discuss ; call transferred to front office. Appt scheduled w/Dr Meredeth Stallion on 01/25/24.

## 2024-01-24 ENCOUNTER — Encounter: Payer: Self-pay | Admitting: Internal Medicine

## 2024-01-24 NOTE — Progress Notes (Unsigned)
   CC: follow up   HPI:  [name] is a 58 y.o. with medical history of HTN, class I obesity, vitamin D  deficiency presenting to [facility] for a follow up.   Please see problem-based list for further details, assessments, and plans.  Past Medical History:  Diagnosis Date   Allergy    Asthma    Essential hypertension      Current Outpatient Medications (Cardiovascular):    amLODipine  (NORVASC ) 5 MG tablet, Take 1 tablet (5 mg total) by mouth daily.  Current Outpatient Medications (Respiratory):    fluticasone  (FLONASE ) 50 MCG/ACT nasal spray, Place 2 sprays into both nostrils 2 (two) times daily.    Current Outpatient Medications (Other):    orlistat  (ALLI ) 60 MG capsule, Take 1 capsule (60 mg total) by mouth 3 (three) times daily with meals.   phentermine  15 MG capsule, Take 1 capsule (15 mg total) by mouth every morning.   Semaglutide -Weight Management (WEGOVY ) 1.7 MG/0.75ML SOAJ, Inject 1.7 mg into the skin once a week.  Review of Systems:  Review of system negative unless stated in the problem list or HPI.    Physical Exam:  There were no vitals filed for this visit. Physical Exam General: NAD HENT: NCAT Lungs: CTAB, no wheeze, rhonchi or rales.  Cardiovascular: Normal heart sounds, no r/m/g, 2+ pulses in all extremities. No LE edema Abdomen: No TTP, normal bowel sounds MSK: No asymmetry or muscle atrophy.  Skin: no lesions noted on exposed skin Neuro: Alert and oriented x4. CN grossly intact Psych: Normal mood and normal affect   Assessment & Plan:   No problem-specific Assessment & Plan notes found for this encounter.   See Encounters Tab for problem based charting.  Patient Discussed with Dr. {NAMES:[id number]::"Guilloud","Hoffman","Mullen","Narendra","Vincent","Machen","Lau","Hatcher","Williams"} Jackolyn Masker, MD Tommas Fragmin. [facility] Internal Medicine Residency, PGY-3   HTN On amlodipine  5 mg every day. Well controlled at home.   Class I  obesity  Care Gaps HIV screening Hep C screening MM Colonoscopy: referral  Vitamin D  deficiency last checked many years ago. Repeat this visit. Hepatitis C, HIV check.

## 2024-01-25 ENCOUNTER — Telehealth: Payer: Self-pay

## 2024-01-25 ENCOUNTER — Ambulatory Visit: Payer: Self-pay | Admitting: Internal Medicine

## 2024-01-25 VITALS — BP 141/71 | HR 62 | Temp 98.1°F | Ht 64.0 in | Wt 191.2 lb

## 2024-01-25 DIAGNOSIS — Z1159 Encounter for screening for other viral diseases: Secondary | ICD-10-CM

## 2024-01-25 DIAGNOSIS — Z6832 Body mass index (BMI) 32.0-32.9, adult: Secondary | ICD-10-CM

## 2024-01-25 DIAGNOSIS — E559 Vitamin D deficiency, unspecified: Secondary | ICD-10-CM

## 2024-01-25 DIAGNOSIS — E66811 Obesity, class 1: Secondary | ICD-10-CM

## 2024-01-25 DIAGNOSIS — Z114 Encounter for screening for human immunodeficiency virus [HIV]: Secondary | ICD-10-CM

## 2024-01-25 DIAGNOSIS — E669 Obesity, unspecified: Secondary | ICD-10-CM

## 2024-01-25 DIAGNOSIS — Z1211 Encounter for screening for malignant neoplasm of colon: Secondary | ICD-10-CM

## 2024-01-25 DIAGNOSIS — I10 Essential (primary) hypertension: Secondary | ICD-10-CM

## 2024-01-25 DIAGNOSIS — Z0000 Encounter for general adult medical examination without abnormal findings: Secondary | ICD-10-CM

## 2024-01-25 MED ORDER — FLUTICASONE PROPIONATE 50 MCG/ACT NA SUSP: 2.0000 | Freq: Two times a day (BID) | NASAL | 1 refills | Status: AC | PRN

## 2024-01-25 MED ORDER — PHENTERMINE HCL 15 MG PO CAPS: 15.0000 mg | ORAL_CAPSULE | ORAL | 2 refills | Status: DC

## 2024-01-25 NOTE — Telephone Encounter (Signed)
 Prior Authorization for patient (Phentermine  HCl 15MG  capsules) came through on cover my meds was submitted with last office notes awaiting approval or denial.  KEY:[id number]

## 2024-01-25 NOTE — Patient Instructions (Signed)
 [name], it was a pleasure seeing you today! You endorsed feeling well today. Below are some of the things we talked about this visit. We look forward to seeing you in the follow up appointment!  Today we discussed: We will restart your phentermine . I will see if there is any way to get ozempic  covered by your insurance. Continue the lifestyle changes that you are doing.   I am going to to some blood work today. I sent a referral for colonoscopy. Schedule the mammogram at your convenience.   I have ordered the following labs today:  Lab Orders         BMP8+Anion Gap         Lipid Profile         Vitamin D  (25 hydroxy)         HIV antibody (with reflex)         Hepatitis C Ab reflex to Quant PCR        Referrals ordered today:   Referral Orders         Ambulatory referral to Gastroenterology       I have ordered the following medication/changed the following medications:   Stop the following medications: Medications Discontinued During This Encounter  Medication Reason   orlistat  (ALLI ) 60 MG capsule Patient has not taken in last 30 days   fluticasone  (FLONASE ) 50 MCG/ACT nasal spray Reorder   phentermine  15 MG capsule Reorder     Start the following medications: Meds ordered this encounter  Medications   fluticasone  (FLONASE ) 50 MCG/ACT nasal spray    Sig: Place 2 sprays into both nostrils 2 (two) times daily as needed for allergies or rhinitis.    Dispense:  15.8 mL    Refill:  1   phentermine  15 MG capsule    Sig: Take 1 capsule (15 mg total) by mouth every morning.    Dispense:  30 capsule    Refill:  2     Follow-up: 3 month follow up   Please make sure to arrive 15 minutes prior to your next appointment. If you arrive late, you may be asked to reschedule.   We look forward to seeing you next time. Please call our clinic at [phone] if you have any questions or concerns. The best time to call is Monday-Friday from 9am-4pm, but there is someone  available 24/7. If after hours or the weekend, call the main hospital number and ask for the Internal Medicine Resident On-Call. If you need medication refills, please notify your pharmacy one week in advance and they will send us  a request.  Thank you for letting us  take part in your care. Wishing you the best!  Thank you, Jackolyn Masker, MD

## 2024-01-25 NOTE — Telephone Encounter (Signed)
 Plan Member Name: CACI WANSER Plan Member [id number] Plan Name: [place]  [employer] Plan 0274 Non-Grandfathered Prescriber Name: Jackolyn Masker Prescriber Phone: [phone] Prescriber Fax: [phone] Dear Margaret Pacheco: [facility]  received a request from your provider for coverage of Phentermine  15mg  Capsules. The request was denied because: Your plan only covers this drug for a total of 3 months within a 365-day period for your health condition. We have denied your request for this drug because it is for longer treatment. We reviewed the information we had. Your doctor can send us  any new or missing information for us  to review. For this drug, you may have to meet other criteria. You can request the drug policy for more details. You can also request other plan documents for your review.   Submitting additional information supporting the denial.

## 2024-01-26 NOTE — Assessment & Plan Note (Signed)
 HIV, Hep C, colonoscopy ordered this visit.

## 2024-01-26 NOTE — Assessment & Plan Note (Signed)
 Pt states the wegovy  is very costly and she is unable to afford it. Looks like she has tried multiple GLP 1 and ran into similar problems. She would like to start phentermine  back and states she tolerates this without any problems. Will restart this medication. Advised on things to look out for including difficulty sleeping, palpitations, and headaches. Advised on continued lifestyle modifications.

## 2024-01-26 NOTE — Assessment & Plan Note (Signed)
 Pt not currently on supplementation. Will recheck vitamin D  level this visit.

## 2024-01-26 NOTE — Assessment & Plan Note (Addendum)
 On amlodipine  5 mg every day. Well controlled at home. Reports readings of 120s/60s. Will continue with current regimen.

## 2024-01-27 NOTE — Progress Notes (Signed)
 Internal Medicine Clinic Attending  Case discussed with the resident at the time of the visit.  We reviewed the resident's history and exam and pertinent patient test results.  I agree with the assessment, diagnosis, and plan of care documented in the resident's note.

## 2024-03-03 ENCOUNTER — Other Ambulatory Visit: Payer: Self-pay | Admitting: Internal Medicine

## 2024-03-03 DIAGNOSIS — E66811 Obesity, class 1: Secondary | ICD-10-CM

## 2024-03-03 MED ORDER — PHENTERMINE HCL 15 MG PO CAPS: 15.0000 mg | ORAL_CAPSULE | ORAL | 2 refills | Status: DC

## 2024-03-03 NOTE — Telephone Encounter (Signed)
 Copied from CRM [id number]. Topic: Clinical - Medication Refill >> Mar 03, 2024  3:35 PM Tisa Forester wrote: Medication:  phentermine  15 MG capsule  Has the patient contacted their pharmacy? No (Agent: If no, request that the patient contact the pharmacy for the refill. If patient does not wish to contact the pharmacy document the reason why and proceed with request.) (Agent: If yes, when and what did the pharmacy advise?)  This is the patient's preferred pharmacy:  [id number] - [place], [place] - [address] AT [facility] [address] [place] [address] Phone: [phone] Fax: [phone]  Is this the correct pharmacy for this prescription? Yes If no, delete pharmacy and type the correct one.   Has the prescription been filled recently? Yes  Is the patient out of the medication? No will be out in 4 days   Has the patient been seen for an appointment in the last year OR does the patient have an upcoming appointment? Yes  Can we respond through MyChart? Yes  Agent: Please be advised that Rx refills may take up to 3 business days. We ask that you follow-up with your pharmacy.

## 2024-03-14 ENCOUNTER — Other Ambulatory Visit: Payer: Self-pay | Admitting: Student

## 2024-03-14 DIAGNOSIS — Z1231 Encounter for screening mammogram for malignant neoplasm of breast: Secondary | ICD-10-CM

## 2024-03-15 ENCOUNTER — Encounter (HOSPITAL_BASED_OUTPATIENT_CLINIC_OR_DEPARTMENT_OTHER): Admitting: Radiology

## 2024-03-16 ENCOUNTER — Encounter

## 2024-03-16 DIAGNOSIS — Z1231 Encounter for screening mammogram for malignant neoplasm of breast: Secondary | ICD-10-CM

## 2024-03-22 ENCOUNTER — Ambulatory Visit
Admission: RE | Admit: 2024-03-22 | Discharge: 2024-03-22 | Disposition: A | Discharge status: Home / Self Care | Source: Ambulatory Visit | Attending: Internal Medicine | Admitting: Internal Medicine

## 2024-03-22 DIAGNOSIS — Z1231 Encounter for screening mammogram for malignant neoplasm of breast: Secondary | ICD-10-CM

## 2024-03-25 ENCOUNTER — Encounter: Payer: Self-pay | Admitting: Internal Medicine

## 2024-04-08 ENCOUNTER — Other Ambulatory Visit: Payer: Self-pay | Admitting: Student

## 2024-04-08 ENCOUNTER — Telehealth: Payer: Self-pay | Admitting: *Deleted

## 2024-04-08 DIAGNOSIS — I10 Essential (primary) hypertension: Secondary | ICD-10-CM

## 2024-04-08 NOTE — Telephone Encounter (Signed)
 Copied from CRM [id number]. Topic: Clinical - Medication Refill >> Apr 08, 2024 10:18 AM Diannia H wrote: Medication: amLODipine  (NORVASC ) 5 MG tablet  Has the patient contacted their pharmacy? Yes (Agent: If no, request that the patient contact the pharmacy for the refill. If patient does not wish to contact the pharmacy document the reason why and proceed with request.) (Agent: If yes, when and what did the pharmacy advise?)  This is the patient's preferred pharmacy:  [id number] - [place], [place] - [address] AT [facility] OF [address] [place] [address] Phone: [phone] Fax: [phone]  Is this the correct pharmacy for this prescription? Yes If no, delete pharmacy and type the correct one.   Has the prescription been filled recently? No  Is the patient out of the medication? No  Has the patient been seen for an appointment in the last year OR does the patient have an upcoming appointment? Yes  Can we respond through MyChart? Yes  Agent: Please be advised that Rx refills may take up to 3 business days. We ask that you follow-up with your pharmacy.

## 2024-04-08 NOTE — Telephone Encounter (Signed)
 Copied from CRM [id number]. Topic: Clinical - Medication Question >> Apr 08, 2024 10:20 AM Diannia H wrote: Reason for CRM:  Patient 's provider stated will be increasing the medication  phentermine  15 MG capsule after a month Medication:  phentermine  15 MG capsule  She got a refill recently but it was for the same MG, could you assist? Patients callback number is [phone]

## 2024-04-15 NOTE — Telephone Encounter (Signed)
 Called pt - no answer, left message on vm  for pt to schedule an appt to discuss increasing Phentermine .

## 2024-04-22 ENCOUNTER — Ambulatory Visit: Payer: Self-pay | Admitting: Student

## 2024-04-22 VITALS — BP 138/78 | HR 67 | Temp 98.0°F | Ht 64.0 in | Wt 187.4 lb

## 2024-04-22 DIAGNOSIS — E559 Vitamin D deficiency, unspecified: Secondary | ICD-10-CM | POA: Diagnosis not present

## 2024-04-22 DIAGNOSIS — Z114 Encounter for screening for human immunodeficiency virus [HIV]: Secondary | ICD-10-CM

## 2024-04-22 DIAGNOSIS — E66811 Obesity, class 1: Secondary | ICD-10-CM

## 2024-04-22 DIAGNOSIS — E669 Obesity, unspecified: Secondary | ICD-10-CM

## 2024-04-22 DIAGNOSIS — Z1211 Encounter for screening for malignant neoplasm of colon: Secondary | ICD-10-CM

## 2024-04-22 DIAGNOSIS — I10 Essential (primary) hypertension: Secondary | ICD-10-CM

## 2024-04-22 DIAGNOSIS — Z6832 Body mass index (BMI) 32.0-32.9, adult: Secondary | ICD-10-CM

## 2024-04-22 DIAGNOSIS — Z0000 Encounter for general adult medical examination without abnormal findings: Secondary | ICD-10-CM

## 2024-04-22 DIAGNOSIS — Z1159 Encounter for screening for other viral diseases: Secondary | ICD-10-CM

## 2024-04-22 NOTE — Patient Instructions (Signed)
 Please obtain BP cuff and record readings twice per day and record them on log. Bring this and BP cuff in to nurse visit in two weeks.

## 2024-04-22 NOTE — Progress Notes (Signed)
 CC: Follow-up  HPI:  Margaret Pacheco is a 58 y.o. female living with a history stated below and presents today for follow-up. Please see problem based assessment and plan for additional details.  Past Medical History:  Diagnosis Date   Allergy    Asthma    Essential hypertension    Family history of breast cancer 12/09/2022   Family history of pancreatic cancer 12/09/2022   Polyuria 06/17/2023   Proteinuria 11/24/2017    Current Outpatient Medications on File Prior to Visit  Medication Sig Dispense Refill   amLODipine  (NORVASC ) 5 MG tablet Take 1 tablet (5 mg total) by mouth daily. 30 tablet 11   fluticasone  (FLONASE ) 50 MCG/ACT nasal spray Place 2 sprays into both nostrils 2 (two) times daily as needed for allergies or rhinitis. 15.8 mL 1   No current facility-administered medications on file prior to visit.    Family History  Problem Relation Age of Onset   Heart disease Mother    Lung cancer Father        dx 53s-80s   Pancreatic cancer Sister 17   Other Sister 42       astrocytoma   Pancreatic cancer Maternal Aunt        dx 74s   Breast cancer Maternal Aunt        d. 43   Prostate cancer Maternal Uncle    Pancreatic cancer Maternal Grandfather        dx 85s   Cancer Cousin        mat cousins; breast (female in 61s); gastric (female in 59s); pancreatic (female in 46s)    Social History   Socioeconomic History   Marital status: Media planner    Spouse name: Not on file   Number of children: Not on file   Years of education: Not on file   Highest education level: Not on file  Occupational History   Not on file  Tobacco Use   Smoking status: Never   Smokeless tobacco: Never  Vaping Use   Vaping status: Never Used  Substance and Sexual Activity   Alcohol use: No    Alcohol/week: 0.0 standard drinks of alcohol   Drug use: No   Sexual activity: Not on file  Other Topics Concern   Not on file  Social History Narrative   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[employer]      ___________________      Breakfast - usually no breakfast, banana or another fruit   Lunch - usually no lunch, crackers on weekends, nuts, dried fruit, Chex-Mix   Dinner - chicken or [place] baked with a vegetable and rice or potatoes, rarely red meats, salads   Snack - fruit or popcorn, yogurt   Drink - water, cranberry or orange juice, sometimes ginger-ale      Social Drivers of Corporate investment banker Strain: Low Risk  (04/22/2024)   Overall Financial Resource Strain (CARDIA)    Difficulty of Paying Living Expenses: Not hard at all  Food Insecurity: No Food Insecurity (04/22/2024)   Hunger Vital Sign    Worried About Running Out of Food in the Last Year: Never true    Ran Out of Food in the Last Year: Never true  Transportation Needs: No Transportation Needs (04/22/2024)   PRAPARE - Administrator, Civil Service (Medical): No    Lack of Transportation (Non-Medical): No  Physical Activity: Sufficiently Active (04/22/2024)   Exercise Vital Sign  Days of Exercise per Week: 4 days    Minutes of Exercise per Session: 90 min  Stress: No Stress Concern Present (04/22/2024)   [employer] of Occupational Health - Occupational Stress Questionnaire    Feeling of Stress: Not at all  Social Connections: Socially Integrated (04/22/2024)   Social Connection and Isolation Panel    Frequency of Communication with Friends and Family: More than three times a week    Frequency of Social Gatherings with Friends and Family: More than three times a week    Attends Religious Services: More than 4 times per year    Active Member of [employer] or Organizations: Yes    Attends Banker Meetings: More than 4 times per year    Marital Status: Living with partner  Intimate Partner Violence: Not At Risk (04/22/2024)   Humiliation, Afraid, Rape, and Kick questionnaire    Fear of Current or Ex-Partner: No    Emotionally  Abused: No    Physically Abused: No    Sexually Abused: No    Review of Systems: ROS negative except for what is noted on the assessment and plan.  Vitals:   04/22/24 0946  BP: 138/78  Pulse: 67  Temp: 98 F (36.7 C)  TempSrc: Oral  SpO2: 100%  Weight: 187 lb 6.4 oz (85 kg)  Height: 5' 4 (1.626 m)    Physical Exam: Constitutional: well-appearing, sitting in chair, in no acute distress Cardiovascular: regular rate and rhythm, no m/r/g Pulmonary/Chest: normal work of breathing on room air, lungs clear to auscultation bilaterally MSK: normal bulk and tone Skin: warm and dry  Assessment & Plan:     Patient discussed with Dr. Jeanelle  Essential hypertension Mildly above goal at 138/78.  Currently managed with amlodipine  5 mg daily.  She was started on phentermine  15 mg daily for weight loss last office visit in 01/2024.  This will be discontinued today.  Does not check BP regularly. She will return in 2 weeks for nurse visit/BP check/cuff calibration and bring in BP log.  I anticipate further improvement in BP after discontinuation of phentermine .  She works out regularly and eats a healthy diet.  Obesity (BMI 30.0-34.9) BMI is 32.1.  She is a muscular person and works out regularly.  I do not feel her BMI is a true reflection of her weight.  She was started on phentermine  15 mg back in May and was here to discuss medication increase for this.  Given her history of hypertension, I discussed that an increase in medication would not be recommended.  She is okay with discontinuing medication altogether and continuing to work on both diet and exercise though she is a very active person and eats healthy.  There are improvements that she can make which she will continue to focus on.  Preventative health care HIV, hep C screening were ordered last OV but labs were not drawn.  These were drawn today.  Referral sent for screening colonoscopy.  Vitamin D  deficiency Check vitamin D  per  overdue lab from May.   Norman Lobstein, D.O. [facility] Internal [facility], PGY-2 Phone: [phone] Date 04/24/2024 Time 10:08 AM

## 2024-04-23 LAB — BMP8+ANION GAP
Anion Gap: 19 mmol/L — ABNORMAL HIGH (ref 10.0–18.0)
BUN/Creatinine Ratio: 16 (ref 9–23)
BUN: 13 mg/dL (ref 6–24)
CO2: 24 mmol/L (ref 20–29)
Calcium: 9.2 mg/dL (ref 8.7–10.2)
Chloride: 101 mmol/L (ref 96–106)
Creatinine, Ser: 0.81 mg/dL (ref 0.57–1.00)
Glucose: 74 mg/dL (ref 70–99)
Potassium: 4.7 mmol/L (ref 3.5–5.2)
Sodium: 144 mmol/L (ref 134–144)
eGFR: 85 mL/min/1.73 (ref >59)

## 2024-04-23 LAB — HCV AB W REFLEX TO QUANT PCR: HCV Ab: NONREACTIVE

## 2024-04-23 LAB — HCV INTERPRETATION

## 2024-04-23 LAB — LIPID PANEL
Chol/HDL Ratio: 2.4 ratio (ref 0.0–4.4)
Cholesterol, Total: 161 mg/dL (ref 100–199)
HDL: 67 mg/dL (ref >39)
LDL Chol Calc (NIH): 82 mg/dL (ref 0–99)
Triglycerides: 63 mg/dL (ref 0–149)
VLDL Cholesterol Cal: 12 mg/dL (ref 5–40)

## 2024-04-23 LAB — HIV ANTIBODY (ROUTINE TESTING W REFLEX): HIV Screen 4th Generation wRfx: NONREACTIVE

## 2024-04-23 LAB — VITAMIN D 25 HYDROXY (VIT D DEFICIENCY, FRACTURES): Vit D, 25-Hydroxy: 12.7 ng/mL — ABNORMAL LOW (ref 30.0–100.0)

## 2024-04-24 NOTE — Assessment & Plan Note (Addendum)
 BMI is 32.1.  She is a muscular person and works out regularly.  I do not feel her BMI is a true reflection of her weight.  She was started on phentermine  15 mg back in May and was here to discuss medication increase for this.  Given her history of hypertension, I discussed that an increase in medication would not be recommended.  She is okay with discontinuing medication altogether and continuing to work on both diet and exercise though she is a very active person and eats healthy.  There are improvements that she can make which she will continue to focus on.

## 2024-04-24 NOTE — Assessment & Plan Note (Addendum)
 HIV, hep C screening were ordered last OV but labs were not drawn.  These were drawn today.  Referral sent for screening colonoscopy.

## 2024-04-24 NOTE — Assessment & Plan Note (Signed)
 Check vitamin D  per overdue lab from May.

## 2024-04-24 NOTE — Assessment & Plan Note (Signed)
 Mildly above goal at 138/78.  Currently managed with amlodipine  5 mg daily.  She was started on phentermine  15 mg daily for weight loss last office visit in 01/2024.  This will be discontinued today.  Does not check BP regularly. She will return in 2 weeks for nurse visit/BP check/cuff calibration and bring in BP log.  I anticipate further improvement in BP after discontinuation of phentermine .  She works out regularly and eats a healthy diet.

## 2024-04-26 NOTE — Progress Notes (Signed)
 Internal Medicine Clinic Attending  Case discussed with the resident at the time of the visit.  We reviewed the resident's history and exam and pertinent patient test results.  I agree with the assessment, diagnosis, and plan of care documented in the resident's note.

## 2024-04-29 ENCOUNTER — Other Ambulatory Visit: Payer: Self-pay | Admitting: Student

## 2024-05-02 ENCOUNTER — Ambulatory Visit: Payer: Self-pay | Admitting: Student

## 2024-05-02 ENCOUNTER — Other Ambulatory Visit: Payer: Self-pay | Admitting: Student

## 2024-05-02 ENCOUNTER — Encounter: Payer: Self-pay | Admitting: Student

## 2024-05-02 ENCOUNTER — Telehealth: Admitting: Physician Assistant

## 2024-05-02 DIAGNOSIS — L02212 Cutaneous abscess of back [any part, except buttock]: Secondary | ICD-10-CM | POA: Diagnosis not present

## 2024-05-02 MED ORDER — SULFAMETHOXAZOLE-TRIMETHOPRIM 800-160 MG PO TABS: 1.0000 | ORAL_TABLET | Freq: Two times a day (BID) | ORAL | 0 refills | Status: AC

## 2024-05-02 MED ORDER — CHOLECALCIFEROL 125 MCG (5000 UT) PO TABS: 1.0000 | ORAL_TABLET | Freq: Every day | ORAL | 0 refills | Status: AC

## 2024-05-02 NOTE — Progress Notes (Signed)

## 2024-05-05 ENCOUNTER — Ambulatory Visit

## 2024-07-22 ENCOUNTER — Encounter: Payer: Self-pay | Admitting: Internal Medicine
